# Patient Record
Sex: Female | Born: 1935 | Race: White | Hispanic: No | Marital: Married | Smoking: Never smoker
Health system: Southern US, Community
[De-identification: ages and names within clinical notes are randomized; demographics above are authoritative.]

## PROBLEM LIST (undated history)

## (undated) DIAGNOSIS — D689 Coagulation defect, unspecified: Secondary | ICD-10-CM

## (undated) DIAGNOSIS — E119 Type 2 diabetes mellitus without complications: Secondary | ICD-10-CM

## (undated) DIAGNOSIS — E079 Disorder of thyroid, unspecified: Secondary | ICD-10-CM

## (undated) DIAGNOSIS — R233 Spontaneous ecchymoses: Secondary | ICD-10-CM

## (undated) DIAGNOSIS — I10 Essential (primary) hypertension: Secondary | ICD-10-CM

## (undated) DIAGNOSIS — R238 Other skin changes: Secondary | ICD-10-CM

## (undated) HISTORY — DX: Spontaneous ecchymoses: R23.3

## (undated) HISTORY — DX: Coagulation defect, unspecified: D68.9

## (undated) HISTORY — DX: Essential (primary) hypertension: I10

## (undated) HISTORY — DX: Type 2 diabetes mellitus without complications: E11.9

## (undated) HISTORY — PX: OTHER SURGICAL HISTORY: SHX169

## (undated) HISTORY — DX: Other skin changes: R23.8

## (undated) HISTORY — DX: Disorder of thyroid, unspecified: E07.9

---

## 2013-10-12 ENCOUNTER — Encounter (INDEPENDENT_AMBULATORY_CARE_PROVIDER_SITE_OTHER): Payer: Self-pay

## 2013-10-12 ENCOUNTER — Encounter: Payer: Self-pay | Admitting: Podiatrist

## 2013-10-12 ENCOUNTER — Ambulatory Visit (INDEPENDENT_AMBULATORY_CARE_PROVIDER_SITE_OTHER): Payer: Medicare Other | Admitting: Podiatrist

## 2013-10-12 VITALS — BP 151/83 | HR 90 | Resp 18

## 2013-10-12 DIAGNOSIS — M79609 Pain in unspecified limb: Secondary | ICD-10-CM

## 2013-10-12 DIAGNOSIS — B351 Tinea unguium: Secondary | ICD-10-CM

## 2013-10-12 NOTE — Patient Instructions (Signed)

## 2013-10-12 NOTE — Progress Notes (Signed)
  Subjective:    Patient ID: Martha Walters, female    DOB: 11/26/1929, 79 y.o.   MRN: [id number]  HPI trim my nails and patient is present with daughter.  Patient presents today for routine nail care.     Review of Systems     Objective:   Physical Exam  Neurovascular status is intact and unchanged. Patient's toenails are thickened, discolored, dystrophic, and clinically mycotic x10.      Assessment & Plan:  Assessment: Symptomatic onychomycosis x10 Plan: Debrided the toenails without complication. She will be seen back in 3-6 months for followup

## 2014-01-11 ENCOUNTER — Ambulatory Visit: Payer: Medicare Other | Admitting: Podiatrist

## 2014-01-18 ENCOUNTER — Ambulatory Visit: Payer: Medicare Other | Admitting: Podiatrist

## 2014-02-15 ENCOUNTER — Ambulatory Visit: Payer: Medicare Other | Admitting: Podiatrist

## 2014-04-26 ENCOUNTER — Encounter: Payer: Self-pay | Admitting: Podiatrist

## 2014-04-26 ENCOUNTER — Ambulatory Visit (INDEPENDENT_AMBULATORY_CARE_PROVIDER_SITE_OTHER): Payer: Medicare Other | Admitting: Podiatrist

## 2014-04-26 VITALS — BP 149/64 | HR 83 | Resp 18

## 2014-04-26 DIAGNOSIS — M79609 Pain in unspecified limb: Secondary | ICD-10-CM

## 2014-04-26 DIAGNOSIS — B351 Tinea unguium: Secondary | ICD-10-CM

## 2014-04-26 NOTE — Progress Notes (Signed)
I am here to get my toenails trimmed up and to get some new shoes  HPI: Patient presents today for follow up of diabetic foot and nail care. Past medical history, meds, and allergies reviewed. Patient states blood sugar is under fair  control-- "up and down".  On coumadin for a blood clot in the right leg 15 years ago.  Objective:   Objective:  Patients chart is reviewed.  Vascular status reveals pedal pulses noted at  1 out of 4 dp and pt bilateral .  Neurological sensation is Decreased to [employer] monofilament bilateral at 3/5 sites bilateral.  Dermatological exam reveals  absence of pre ulcerative/ hyperkeratotic lesions.   Toenails are elongated, incurvated, discolored, dystrophic with ingrown deformity present.  Prominent metatarsals 1,5 bilateral are present and asymptomatic   Assessment: Diabetes with Neuropathy/Angiopathy , Ingrown nail deformity, prominent metatarsals  Plan: Discussed treatment options and alternatives. Debrided nails without complication. Recommended diabetic shoes and will fill out the form for her primary care physician.  Return appointment recommended at routine intervals of 3 months.   Trudie Buckler, DPM

## 2014-04-26 NOTE — Patient Instructions (Signed)
DIABETIC SHOES-  Diabetic shoes and accomidative inserts are indicated for those persons who have Diabetes and who are at an increased for a foot ulceration.  This requires patients to have decreased feeling in the feet, circulation problems, as well as an abnormal foot structure, or a combination of these disorders.  As podiatrists, we can recommend a diabetic shoe to your primary care doctor or endocrinologist HOWEVER, it is up to them to decide if you are eligible or in need of these shoes.  Before we can take measurements or do the ordering of a diabetic shoe and accomidative insert, it is required that we get a signed authorization from your physician both confirming you have Diabetes and that you are at risk for an ulceration.  Please note, if you physician will not sign for the shoes, there is no way we can go around him or her.  You are welcome to place an order for the shoes, however the financial responsibility is up to you and your insurance cannot be billed.   Once, approval is granted for the diabetic shoes and inserts we will make you a separate appointment to be measured, casted, and for you to choose the diabetic shoe.  We will then call you when they are ready for dispensing and you will be seen to make sure the shoes and inserts fit your foot properly.  As this is a multi-step process, it generally takes 4-8 weeks from start to finish.  You cooperation is appreciated.  If it has been more than 6 weeks from the date you ordered your diabetic shoe and you have not heard from our office, please give us a call.     

## 2014-06-21 DIAGNOSIS — M6530 Trigger finger, unspecified finger: Secondary | ICD-10-CM | POA: Insufficient documentation

## 2014-08-02 ENCOUNTER — Ambulatory Visit (INDEPENDENT_AMBULATORY_CARE_PROVIDER_SITE_OTHER): Payer: Medicare Other | Admitting: Podiatrist

## 2014-08-02 VITALS — BP 157/77 | HR 77 | Resp 18

## 2014-08-02 DIAGNOSIS — M79609 Pain in unspecified limb: Secondary | ICD-10-CM

## 2014-08-02 DIAGNOSIS — B351 Tinea unguium: Secondary | ICD-10-CM

## 2014-08-02 DIAGNOSIS — M79676 Pain in unspecified toe(s): Principal | ICD-10-CM

## 2014-08-02 NOTE — Progress Notes (Signed)
HPI: Patient presents today for follow up of diabetic foot and nail care. Past medical history, meds, and allergies reviewed. Patient states blood sugar is under fair control-- . On coumadin for a blood clot in the right leg 15 years ago.   Objective: Objective: Patients chart is reviewed. Vascular status reveals pedal pulses noted at 1 out of 4 dp and pt bilateral . Neurological sensation is Decreased to [employer] monofilament bilateral at 3/5 sites bilateral. Dermatological exam reveals absence of pre ulcerative/ hyperkeratotic lesions. Toenails are elongated, incurvated, discolored, dystrophic with ingrown deformity present. Prominent metatarsals 1,5 bilateral are present and asymptomatic   Assessment: Diabetes with Neuropathy/Angiopathy , Ingrown nail deformity, prominent metatarsals   Plan: Discussed treatment options and alternatives. Debrided nails without complication.  Return appointment recommended at routine intervals of 3 months.

## 2014-10-31 ENCOUNTER — Ambulatory Visit: Payer: Medicare Other

## 2014-11-01 ENCOUNTER — Ambulatory Visit: Payer: Medicare Other | Admitting: Podiatrist

## 2015-07-19 ENCOUNTER — Encounter: Payer: Self-pay | Admitting: Podiatry

## 2015-07-19 ENCOUNTER — Ambulatory Visit (INDEPENDENT_AMBULATORY_CARE_PROVIDER_SITE_OTHER): Payer: Medicare Other | Admitting: Podiatry

## 2015-07-19 DIAGNOSIS — B351 Tinea unguium: Secondary | ICD-10-CM

## 2015-07-19 DIAGNOSIS — M79676 Pain in unspecified toe(s): Secondary | ICD-10-CM

## 2015-07-19 NOTE — Progress Notes (Signed)
Patient ID: Martha Walters, female   DOB: Dec 25, 1928, 81 y.o.   MRN: [id number] Complaint:  Visit Type: Patient returns to my office for continued preventative foot care services. Complaint: Patient states" my nails have grown long and thick and become painful to walk and wear shoes" Patient has been diagnosed with DM with no foot complications. The patient presents for preventative foot care services. No changes to ROS.  She requests diabetic shoes.  Podiatric Exam: Vascular: dorsalis pedis and posterior tibial pulses are palpable bilateral. Capillary return is immediate. Temperature gradient is WNL. Skin turgor WNL  Sensorium: Diminished  [employer] monofilament test. Normal tactile sensation bilaterally. Nail Exam: Pt has thick disfigured discolored nails with subungual debris noted bilateral entire nail hallux through fifth toenails Ulcer Exam: There is no evidence of ulcer or pre-ulcerative changes or infection. Orthopedic Exam: Muscle tone and strength are WNL. No limitations in general ROM. No crepitus or effusions noted. Foot type and digits show no abnormalities. Bony prominences are unremarkable. Hammer toes 2,3 B/L Skin: No Porokeratosis. No infection or ulcers  Diagnosis:  Onychomycosis, , Pain in right toe, pain in left toes,  Hammer toes B/L  Treatment & Plan Procedures and Treatment: Consent by patient was obtained for treatment procedures. The patient understood the discussion of treatment and procedures well. All questions were answered thoroughly reviewed. Debridement of mycotic and hypertrophic toenails, 1 through 5 bilateral and clearing of subungual debris. No ulceration, no infection noted. Initiated paperwork for diabetic shoes. Return Visit-Office Procedure: Patient instructed to return to the office for a follow up visit 3 months for continued evaluation and treatment.

## 2015-10-18 ENCOUNTER — Ambulatory Visit: Payer: Medicare Other | Admitting: Podiatry

## 2015-10-18 ENCOUNTER — Ambulatory Visit: Payer: Medicare Other | Admitting: Sports Medicine

## 2015-10-19 ENCOUNTER — Ambulatory Visit: Payer: Medicare Other | Admitting: Sports Medicine

## 2015-10-23 ENCOUNTER — Ambulatory Visit: Payer: Medicare Other

## 2015-12-26 DIAGNOSIS — C9110 Chronic lymphocytic leukemia of B-cell type not having achieved remission: Secondary | ICD-10-CM | POA: Insufficient documentation

## 2016-03-06 ENCOUNTER — Encounter: Payer: Self-pay | Admitting: Sports Medicine

## 2016-03-06 ENCOUNTER — Ambulatory Visit (INDEPENDENT_AMBULATORY_CARE_PROVIDER_SITE_OTHER): Payer: Medicare Other | Admitting: Sports Medicine

## 2016-03-06 DIAGNOSIS — B351 Tinea unguium: Secondary | ICD-10-CM

## 2016-03-06 DIAGNOSIS — I739 Peripheral vascular disease, unspecified: Secondary | ICD-10-CM

## 2016-03-06 DIAGNOSIS — E119 Type 2 diabetes mellitus without complications: Secondary | ICD-10-CM

## 2016-03-06 DIAGNOSIS — M79676 Pain in unspecified toe(s): Secondary | ICD-10-CM

## 2016-03-06 NOTE — Progress Notes (Signed)
Patient ID: Martha Walters, female   DOB: 1929-04-09, 82 y.o.   MRN: [id number] Subjective: Martha Walters is a 82 y.o. female patient with history of diabetes who presents to office today complaining of long, painful nails  while ambulating in shoes; unable to trim. Patient states that the glucose reading this morning was"good"; typically runs well off and on.  Patient denies any new changes in medication or new problems. Patient denies any new cramping, numbness, burning or tingling in the legs.  Reports that they have difficulty getting to appointment and have to wait until their daughter comes from [place] to bring them.   Patient Active Problem List   Diagnosis Date Noted  . Chronic lymphocytic leukemia ([place]) 12/26/2015  . Acquired trigger finger 06/21/2014   Current Outpatient Prescriptions on File Prior to Visit  Medication Sig Dispense Refill  . allopurinol (ZYLOPRIM) 100 MG tablet     . ciprofloxacin (CIPRO) 500 MG tablet     . citalopram (CELEXA) 10 MG tablet     . gabapentin (NEURONTIN) 600 MG tablet 100 mg.    . glipiZIDE (GLUCOTROL XL) 5 MG 24 hr tablet     . LEUKERAN 2 MG tablet     . levothyroxine (LEVOTHROID) 25 MCG tablet Frequency:   Dosage:175   MCG  Instructions:Levothyroxine Sodium (175MCG TABS, 1daily// 2 mondays Oral )  Note:    . levothyroxine (SYNTHROID, LEVOTHROID) 175 MCG tablet Take 175 mcg by mouth daily before breakfast.    . levothyroxine (SYNTHROID, LEVOTHROID) 200 MCG tablet     . losartan (COZAAR) 25 MG tablet     . LYRICA 50 MG capsule     . metFORMIN (GLUCOPHAGE) 500 MG tablet Take by mouth 2 (two) times daily with a meal.    . metFORMIN (GLUCOPHAGE-XR) 500 MG 24 hr tablet     . metoprolol (LOPRESSOR) 100 MG tablet 50 mg.    . metoprolol (LOPRESSOR) 50 MG tablet Take 50 mg by mouth 2 (two) times daily.    . metoprolol succinate (TOPROL-XL) 50 MG 24 hr tablet     . omega-3 acid ethyl esters (LOVAZA) 1 G capsule     . sitaGLIPtin-metformin (JANUMET) 50-1000 MG per  tablet Take 1 tablet by mouth 2 (two) times daily with a meal.    . VESICARE 10 MG tablet     . warfarin (COUMADIN) 1 MG tablet 6 mg.    . warfarin (COUMADIN) 5 MG tablet     . warfarin (COUMADIN) 6 MG tablet Take 6 mg by mouth daily.     No current facility-administered medications on file prior to visit.   Allergies  Allergen Reactions  . Levaquin [Levofloxacin In D5w]   . Levofloxacin     Other reaction(s): HIVES  . Tetanus Toxoid     Other reaction(s): SWELLING    No results found for this or any previous visit (from the past 2160 hour(s)).  Objective: General: Patient is awake, alert, and oriented x 3 and in no acute distress.  Integument: Skin is warm, dry and supple bilateral. Nails are tender, long, thickened and  dystrophic with subungual debris, consistent with onychomycosis, 1-5 bilateral. No signs of infection. No open lesions or preulcerative lesions present bilateral. Remaining integument unremarkable.  Vasculature:  Dorsalis Pedis pulse 1/4 bilateral. Posterior Tibial pulse  1/4 bilateral faint.  Capillary fill time <5 sec 1-5 bilateral. No hair growth to the level of the digits. Temperature gradient within normal limits. + varicosities present and trace edema bilateral.  Neurology: The patient has diminished sensation measured with a 5.07/10g [employer] Monofilament at all pedal sites bilateral . Vibratory sensation diminished bilateral with tuning fork. No Babinski sign present bilateral.   Musculoskeletal: No gross pedal deformities noted bilateral. Muscular strength 5/5 in all lower extremity muscular groups bilateral without pain on range of motion . No tenderness with calf compression bilateral.  Assessment and Plan: Problem List Items Addressed This Visit    None    Visit Diagnoses    Pain due to onychomycosis of toenail    -  Primary    Diabetes mellitus without complication (HCC)        PVD (peripheral vascular disease) ([place])         -Examined  patient. -Discussed and educated patient on diabetic foot care, especially with  regards to the vascular, neurological and musculoskeletal systems.  -Stressed the importance of good glycemic control and the detriment of not  controlling glucose levels in relation to the foot. -Mechanically debrided all nails 1-5 bilateral using sterile nail nipper and filed with dremel without incident  -Answered all patient questions -Patient to return in 3 months for at risk foot care -Patient advised to call the office if any problems or questions arise in the meantime.  Landis Martins, DPM

## 2016-06-06 ENCOUNTER — Ambulatory Visit: Payer: Medicare Other | Admitting: Sports Medicine

## 2016-07-17 ENCOUNTER — Encounter: Payer: Self-pay | Admitting: Sports Medicine

## 2016-07-17 ENCOUNTER — Ambulatory Visit (INDEPENDENT_AMBULATORY_CARE_PROVIDER_SITE_OTHER): Payer: Medicare Other | Admitting: Sports Medicine

## 2016-07-17 DIAGNOSIS — B351 Tinea unguium: Secondary | ICD-10-CM

## 2016-07-17 DIAGNOSIS — E119 Type 2 diabetes mellitus without complications: Secondary | ICD-10-CM

## 2016-07-17 DIAGNOSIS — M79676 Pain in unspecified toe(s): Secondary | ICD-10-CM

## 2016-07-17 DIAGNOSIS — I739 Peripheral vascular disease, unspecified: Secondary | ICD-10-CM

## 2016-07-17 NOTE — Progress Notes (Signed)
Patient ID: Martha Walters, female   DOB: [birth year], 82 y.o.   MRN: [id number] Subjective: Martha Walters is a 82 y.o. female patient with history of diabetes who presents to office today complaining of long, painful nails  while ambulating in shoes; unable to trim. Patient states that the glucose reading this morning was "high" 190.  Patient denies any new changes in medication or new problems. Patient denies any new cramping, numbness, burning or tingling in the legs.  Patient is assisted by son this visit  Patient Active Problem List   Diagnosis Date Noted  . Chronic lymphocytic leukemia ([place]) 12/26/2015  . Acquired trigger finger 06/21/2014   Current Outpatient Prescriptions on File Prior to Visit  Medication Sig Dispense Refill  . allopurinol (ZYLOPRIM) 100 MG tablet     . ciprofloxacin (CIPRO) 500 MG tablet     . citalopram (CELEXA) 10 MG tablet     . gabapentin (NEURONTIN) 600 MG tablet 100 mg.    . glipiZIDE (GLUCOTROL XL) 5 MG 24 hr tablet     . LEUKERAN 2 MG tablet     . levothyroxine (LEVOTHROID) 25 MCG tablet Frequency:   Dosage:175   MCG  Instructions:Levothyroxine Sodium (175MCG TABS, 1daily// 2 mondays Oral )  Note:    . levothyroxine (SYNTHROID, LEVOTHROID) 175 MCG tablet Take 175 mcg by mouth daily before breakfast.    . levothyroxine (SYNTHROID, LEVOTHROID) 200 MCG tablet     . losartan (COZAAR) 25 MG tablet     . LYRICA 50 MG capsule     . metFORMIN (GLUCOPHAGE) 500 MG tablet Take by mouth 2 (two) times daily with a meal.    . metFORMIN (GLUCOPHAGE-XR) 500 MG 24 hr tablet     . metoprolol (LOPRESSOR) 100 MG tablet 50 mg.    . metoprolol (LOPRESSOR) 50 MG tablet Take 50 mg by mouth 2 (two) times daily.    . metoprolol succinate (TOPROL-XL) 50 MG 24 hr tablet     . omega-3 acid ethyl esters (LOVAZA) 1 G capsule     . sitaGLIPtin-metformin (JANUMET) 50-1000 MG per tablet Take 1 tablet by mouth 2 (two) times daily with a meal.    . VESICARE 10 MG tablet     . warfarin  (COUMADIN) 1 MG tablet 6 mg.    . warfarin (COUMADIN) 5 MG tablet     . warfarin (COUMADIN) 6 MG tablet Take 6 mg by mouth daily.     No current facility-administered medications on file prior to visit.    Allergies  Allergen Reactions  . Levaquin [Levofloxacin In D5w]   . Levofloxacin     Other reaction(s): HIVES  . Tetanus Toxoid     Other reaction(s): SWELLING    No results found for this or any previous visit (from the past 2160 hour(s)).  Objective: General: Patient is awake, alert, and oriented x 3 and in no acute distress. Walker assisted gait.   Integument: Skin is warm, dry and supple bilateral. Nails are tender, long, thickened and dystrophic with subungual debris, consistent with onychomycosis, 1-5 bilateral. No signs of infection. No open lesions or preulcerative lesions present bilateral. Remaining integument unremarkable.  Vasculature:  Dorsalis Pedis pulse 1/4 bilateral. Posterior Tibial pulse  1/4 bilateral faint. Capillary fill time <5 sec 1-5 bilateral. No hair growth to the level of the digits.Temperature gradient within normal limits. + varicosities present and trace edema bilateral.  Neurology: The patient has diminished sensation measured with a 5.07/10g [employer] Monofilament at all pedal  sites bilateral . Vibratory sensation diminished bilateral with tuning fork. No Babinski sign present bilateral.   Musculoskeletal: No gross pedal deformities noted bilateral. Muscular strength 5/5 in all lower extremity muscular groups bilateral without pain on range of motion . No tenderness with calf compression bilateral.  Assessment and Plan: Problem List Items Addressed This Visit    None    Visit Diagnoses    Pain due to onychomycosis of toenail    -  Primary   Diabetes mellitus without complication (HCC)       PVD (peripheral vascular disease) ([place])         -Examined patient. -Discussed and educated patient on diabetic foot care, especially with regards to  the vascular, neurological and musculoskeletal systems.  -Stressed the importance of good glycemic control and the detriment of not controlling glucose levels in relation to the foot. -Mechanically debrided all nails 1-5 bilateral using sterile nail nipper and filed with dremel without incident  -Continue with walker for stability in gait -Answered all patient questions -Patient to return in 3 months for at risk foot care -Patient advised to call the office if any problems or questions arise in the meantime.  Landis Martins, DPM

## 2016-09-29 NOTE — Progress Notes (Signed)
Martha Walters, Martha Walters Female, 82 y.o., [birth year] MRN:  [id number]  Transfer from [facility] per Dr. Graylon Good  82 year old lady with past medical history of hypertension, diabetes mellitus, hypothyroidism, depression, CCL in remission, clotting disorder, DVT on Coumadin, who presents with fall, causing small R front intra-cranial bleeding, 1.00.5 cm without midline shift. No focal neurological deficit. Vital signs stable, INR 2.0, electrolytes and renal function okay. Neurosurgeon, Dr. Cyndy Freeze was consulted by EDP, recommended given vitamin K and repeat CT head in morning. 5 mg of Vk was given by EDP. Pt is accepted to tele bed for obs.   Ivor Costa

## 2016-09-30 ENCOUNTER — Observation Stay (HOSPITAL_COMMUNITY): Payer: Medicare Other

## 2016-09-30 ENCOUNTER — Encounter (HOSPITAL_COMMUNITY): Payer: Self-pay | Admitting: Internal Medicine

## 2016-09-30 ENCOUNTER — Inpatient Hospital Stay (HOSPITAL_COMMUNITY)
Admission: AD | Admit: 2016-09-30 | Discharge: 2016-10-03 | DRG: 083 | Disposition: A | Discharge status: Skilled Nursing Facility | Payer: Medicare Other | Source: Other Acute Inpatient Hospital | Attending: Internal Medicine | Admitting: Internal Medicine

## 2016-09-30 DIAGNOSIS — S0230XA Fracture of orbital floor, unspecified side, initial encounter for closed fracture: Secondary | ICD-10-CM

## 2016-09-30 DIAGNOSIS — W010XXA Fall on same level from slipping, tripping and stumbling without subsequent striking against object, initial encounter: Secondary | ICD-10-CM | POA: Diagnosis present

## 2016-09-30 DIAGNOSIS — M6281 Muscle weakness (generalized): Secondary | ICD-10-CM

## 2016-09-30 DIAGNOSIS — S0240CA Maxillary fracture, right side, initial encounter for closed fracture: Secondary | ICD-10-CM | POA: Diagnosis present

## 2016-09-30 DIAGNOSIS — E1165 Type 2 diabetes mellitus with hyperglycemia: Secondary | ICD-10-CM | POA: Diagnosis present

## 2016-09-30 DIAGNOSIS — S065X9A Traumatic subdural hemorrhage with loss of consciousness of unspecified duration, initial encounter: Secondary | ICD-10-CM | POA: Diagnosis present

## 2016-09-30 DIAGNOSIS — Z7984 Long term (current) use of oral hypoglycemic drugs: Secondary | ICD-10-CM | POA: Diagnosis not present

## 2016-09-30 DIAGNOSIS — Y92009 Unspecified place in unspecified non-institutional (private) residence as the place of occurrence of the external cause: Secondary | ICD-10-CM

## 2016-09-30 DIAGNOSIS — C9110 Chronic lymphocytic leukemia of B-cell type not having achieved remission: Secondary | ICD-10-CM | POA: Diagnosis present

## 2016-09-30 DIAGNOSIS — L304 Erythema intertrigo: Secondary | ICD-10-CM | POA: Diagnosis present

## 2016-09-30 DIAGNOSIS — E119 Type 2 diabetes mellitus without complications: Secondary | ICD-10-CM

## 2016-09-30 DIAGNOSIS — Z7901 Long term (current) use of anticoagulants: Secondary | ICD-10-CM | POA: Diagnosis not present

## 2016-09-30 DIAGNOSIS — Z881 Allergy status to other antibiotic agents status: Secondary | ICD-10-CM

## 2016-09-30 DIAGNOSIS — S02401A Maxillary fracture, unspecified, initial encounter for closed fracture: Secondary | ICD-10-CM

## 2016-09-30 DIAGNOSIS — E039 Hypothyroidism, unspecified: Secondary | ICD-10-CM | POA: Diagnosis present

## 2016-09-30 DIAGNOSIS — Z79899 Other long term (current) drug therapy: Secondary | ICD-10-CM | POA: Diagnosis not present

## 2016-09-30 DIAGNOSIS — R269 Unspecified abnormalities of gait and mobility: Secondary | ICD-10-CM

## 2016-09-30 DIAGNOSIS — S0181XA Laceration without foreign body of other part of head, initial encounter: Secondary | ICD-10-CM | POA: Diagnosis present

## 2016-09-30 DIAGNOSIS — I6200 Nontraumatic subdural hemorrhage, unspecified: Secondary | ICD-10-CM | POA: Diagnosis not present

## 2016-09-30 DIAGNOSIS — I10 Essential (primary) hypertension: Secondary | ICD-10-CM | POA: Diagnosis present

## 2016-09-30 DIAGNOSIS — Z794 Long term (current) use of insulin: Secondary | ICD-10-CM | POA: Diagnosis not present

## 2016-09-30 DIAGNOSIS — S02402A Zygomatic fracture, unspecified, initial encounter for closed fracture: Secondary | ICD-10-CM

## 2016-09-30 DIAGNOSIS — S0083XA Contusion of other part of head, initial encounter: Secondary | ICD-10-CM

## 2016-09-30 DIAGNOSIS — S0280XA Fracture of other specified skull and facial bones, unspecified side, initial encounter for closed fracture: Secondary | ICD-10-CM

## 2016-09-30 DIAGNOSIS — E0865 Diabetes mellitus due to underlying condition with hyperglycemia: Secondary | ICD-10-CM | POA: Diagnosis not present

## 2016-09-30 DIAGNOSIS — R410 Disorientation, unspecified: Secondary | ICD-10-CM

## 2016-09-30 DIAGNOSIS — S02402D Zygomatic fracture, unspecified, subsequent encounter for fracture with routine healing: Secondary | ICD-10-CM | POA: Diagnosis not present

## 2016-09-30 DIAGNOSIS — S065XAA Traumatic subdural hemorrhage with loss of consciousness status unknown, initial encounter: Secondary | ICD-10-CM | POA: Diagnosis present

## 2016-09-30 DIAGNOSIS — S0240EA Zygomatic fracture, right side, initial encounter for closed fracture: Secondary | ICD-10-CM | POA: Diagnosis present

## 2016-09-30 LAB — GLUCOSE, CAPILLARY
GLUCOSE-CAPILLARY: 290 mg/dL — AB (ref 65–99)
GLUCOSE-CAPILLARY: 295 mg/dL — AB (ref 65–99)
Glucose-Capillary: 312 mg/dL — ABNORMAL HIGH (ref 65–99)
Glucose-Capillary: 147 mg/dL — ABNORMAL HIGH (ref 65–99)
Glucose-Capillary: 224 mg/dL — ABNORMAL HIGH (ref 65–99)
Glucose-Capillary: 299 mg/dL — ABNORMAL HIGH (ref 65–99)

## 2016-09-30 LAB — PROTIME-INR
INR: 1.43
Prothrombin Time: 17.6 seconds — ABNORMAL HIGH (ref 11.4–15.2)

## 2016-09-30 MED ORDER — CHLORAMBUCIL 2 MG PO TABS
2.0000 mg | ORAL_TABLET | ORAL | Status: DC
  Administered 2016-09-30 – 2016-10-02 (×2): 2 mg via ORAL
  Filled 2016-09-30 (×3): qty 1

## 2016-09-30 MED ORDER — INSULIN ASPART 100 UNIT/ML ~~LOC~~ SOLN
0.0000 [IU] | SUBCUTANEOUS | Status: DC
  Administered 2016-09-30: 5 [IU] via SUBCUTANEOUS

## 2016-09-30 MED ORDER — CITALOPRAM HYDROBROMIDE 10 MG PO TABS
10.0000 mg | ORAL_TABLET | Freq: Every day | ORAL | Status: DC
  Administered 2016-09-30 – 2016-10-03 (×3): 10 mg via ORAL
  Filled 2016-09-30 (×3): qty 1

## 2016-09-30 MED ORDER — ALLOPURINOL 100 MG PO TABS
100.0000 mg | ORAL_TABLET | Freq: Every day | ORAL | Status: DC
  Administered 2016-09-30 – 2016-10-03 (×4): 100 mg via ORAL
  Filled 2016-09-30 (×4): qty 1

## 2016-09-30 MED ORDER — INSULIN ASPART 100 UNIT/ML ~~LOC~~ SOLN
0.0000 [IU] | Freq: Three times a day (TID) | SUBCUTANEOUS | Status: DC
  Administered 2016-09-30: 5 [IU] via SUBCUTANEOUS
  Administered 2016-09-30: 3 [IU] via SUBCUTANEOUS
  Administered 2016-09-30: 1 [IU] via SUBCUTANEOUS
  Administered 2016-10-01: 9 [IU] via SUBCUTANEOUS
  Administered 2016-10-01: 7 [IU] via SUBCUTANEOUS
  Administered 2016-10-01: 3 [IU] via SUBCUTANEOUS
  Administered 2016-10-02: 7 [IU] via SUBCUTANEOUS
  Administered 2016-10-02 (×2): 3 [IU] via SUBCUTANEOUS
  Administered 2016-10-03: 5 [IU] via SUBCUTANEOUS
  Administered 2016-10-03: 3 [IU] via SUBCUTANEOUS

## 2016-09-30 MED ORDER — FAMOTIDINE 10 MG PO TABS
10.0000 mg | ORAL_TABLET | Freq: Two times a day (BID) | ORAL | Status: DC
  Administered 2016-09-30 – 2016-10-03 (×7): 10 mg via ORAL
  Filled 2016-09-30 (×7): qty 1

## 2016-09-30 MED ORDER — OMEGA-3-ACID ETHYL ESTERS 1 G PO CAPS
1.0000 g | ORAL_CAPSULE | Freq: Two times a day (BID) | ORAL | Status: DC
  Administered 2016-09-30 – 2016-10-03 (×7): 1 g via ORAL
  Filled 2016-09-30 (×7): qty 1

## 2016-09-30 MED ORDER — SODIUM CHLORIDE 0.9% FLUSH
3.0000 mL | Freq: Two times a day (BID) | INTRAVENOUS | Status: DC
  Administered 2016-09-30 – 2016-10-03 (×6): 3 mL via INTRAVENOUS

## 2016-09-30 MED ORDER — NYSTATIN 100000 UNIT/GM EX POWD
Freq: Three times a day (TID) | CUTANEOUS | Status: DC
  Administered 2016-09-30 – 2016-10-03 (×8): via TOPICAL
  Filled 2016-09-30: qty 15

## 2016-09-30 MED ORDER — INSULIN GLARGINE 100 UNIT/ML ~~LOC~~ SOLN
10.0000 [IU] | Freq: Every day | SUBCUTANEOUS | Status: DC
  Administered 2016-09-30 – 2016-10-01 (×3): 10 [IU] via SUBCUTANEOUS
  Filled 2016-09-30 (×4): qty 0.1

## 2016-09-30 MED ORDER — METOPROLOL SUCCINATE ER 25 MG PO TB24
25.0000 mg | ORAL_TABLET | Freq: Every day | ORAL | Status: DC
  Administered 2016-09-30 – 2016-10-01 (×2): 25 mg via ORAL
  Filled 2016-09-30 (×2): qty 1

## 2016-09-30 MED ORDER — INSULIN ASPART 100 UNIT/ML ~~LOC~~ SOLN
0.0000 [IU] | Freq: Every day | SUBCUTANEOUS | Status: DC
  Administered 2016-09-30 – 2016-10-01 (×2): 3 [IU] via SUBCUTANEOUS

## 2016-09-30 MED ORDER — FLUCONAZOLE 100 MG PO TABS
150.0000 mg | ORAL_TABLET | ORAL | Status: DC
  Administered 2016-09-30: 150 mg via ORAL
  Filled 2016-09-30: qty 2

## 2016-09-30 MED ORDER — LEVOTHYROXINE SODIUM 100 MCG PO TABS
200.0000 ug | ORAL_TABLET | Freq: Every day | ORAL | Status: DC
  Administered 2016-09-30 – 2016-10-03 (×4): 200 ug via ORAL
  Filled 2016-09-30 (×4): qty 2

## 2016-09-30 NOTE — Progress Notes (Signed)
Patient admitted directly from [facility] from a mechanical fall yesterday. On arrival, patient verbal, alert and oriented. Vital signs checked and placed on O2@2L   Placed on cardiac monitor and made her comfortable in bed. Call bell placed within reach and bed alarm activated. Will keep monitoring.

## 2016-09-30 NOTE — Progress Notes (Addendum)
PROGRESS NOTE    Martha Walters  [id number] DOB: [birth year] DOA: 09/30/2016 PCP: Martha Spanish, MD   Brief Narrative: Martha Walters is a 82 y.o. female with medical history significant of CLL, DM, HTN, on chronic coumadin for a DVT in leg a couple of years back. Patient presented to [facility] ED after suffering a fall from moving too fast with her walker. She was found to have a subdural hematoma.   Assessment & Plan:   Principal Problem:   SDH (subdural hematoma) (HCC) Active Problems:   DM2 (diabetes mellitus, type 2) (HCC)   HTN (hypertension)   Subdural hemorrhage Sustained after fall. Stable on repeat CT. No abnormal neurological signs -neurosurgery recommendations -continue neuro checks -continue to hold coumadin, hold coumadin on discharge -PT eval  Facial fracture Incomplete right zygomaticomaxillary complex fracture seen on CT this morning. Patient without pain. -ENT (Dr. Erik Obey) consulted  Facial hematoma, right Right supraorbital laceration Swelling improved. Laceration not sutured. Wound is about 16 hours out at this time. May need to allow to heal by secondary intention.  -ENT recommendations  Diabetes mellitus -continue to hold glipizide -continue lantus 10u -continue SSI  Intertrigo -Wound care  DVT prophylaxis: SCDs Code Status: Full code Family Communication: Daughter at bedside Disposition Plan: Likely discharge home tomorrow   Consultants:   Neurosurgery, Dr. Cyndy Freeze  ENT, Dr. Erik Obey  Procedures:  None  Antimicrobials:  None    Subjective: Patient reports no pain today. Feels fine.  Objective: Vitals:   09/30/16 0100 09/30/16 0500 09/30/16 0941  BP: (!) 156/49 (!) 159/55 (!) 161/52  Pulse: 89 85 93  Resp: 18 17 16   Temp: 98.1 F (36.7 C) 98.3 F (36.8 C) 98.4 F (36.9 C)  TempSrc: Oral Oral Oral  SpO2: 97% 98% 96%   No intake or output data in the 24 hours ending 09/30/16 1021 There were no vitals filed for this  visit.  Examination:  General exam: Appears calm and comfortable  Head: no crepitus felt. No overt deformity. Laceration above right upper eye without bleeding and significant swelling on right side of face with bruising. Eyes: EOMI intact bilaterally, no evidence of trapping. Unable to lift right upper eyelid well when looking down Respiratory system: Clear to auscultation. Respiratory effort normal. Cardiovascular system: S1 & S2 heard, RRR. No murmurs, rubs, gallops or clicks. Gastrointestinal system: Abdomen is nondistended, soft and nontender. Normal bowel sounds heard. Central nervous system: Alert and oriented. No focal neurological deficits. Extremities: No edema. No calf tenderness Skin: No cyanosis. Significant inflammatory changes seen under bilateral breasts, under abdominal panus and bilateral inguinal creases Psychiatry: Judgement and insight appear normal. Mood & affect appropriate.     Data Reviewed: I have personally reviewed following labs and imaging studies  CBC: No results for input(s): WBC, NEUTROABS, HGB, HCT, MCV, PLT in the last 168 hours. Basic Metabolic Panel: No results for input(s): NA, K, CL, CO2, GLUCOSE, BUN, CREATININE, CALCIUM, MG, PHOS in the last 168 hours. GFR: CrCl cannot be calculated (Unknown ideal weight.). Liver Function Tests: No results for input(s): AST, ALT, ALKPHOS, BILITOT, PROT, ALBUMIN in the last 168 hours. No results for input(s): LIPASE, AMYLASE in the last 168 hours. No results for input(s): AMMONIA in the last 168 hours. Coagulation Profile:  Recent Labs Lab 09/30/16 0633  INR 1.43   Cardiac Enzymes: No results for input(s): CKTOTAL, CKMB, CKMBINDEX, TROPONINI in the last 168 hours. BNP (last 3 results) No results for input(s): PROBNP in the last 8760 hours. HbA1C: No  results for input(s): HGBA1C in the last 72 hours. CBG:  Recent Labs Lab 09/30/16 0228 09/30/16 0422 09/30/16 0742  GLUCAP 312* 290* 147*   Lipid  Profile: No results for input(s): CHOL, HDL, LDLCALC, TRIG, CHOLHDL, LDLDIRECT in the last 72 hours. Thyroid Function Tests: No results for input(s): TSH, T4TOTAL, FREET4, T3FREE, THYROIDAB in the last 72 hours. Anemia Panel: No results for input(s): VITAMINB12, FOLATE, FERRITIN, TIBC, IRON, RETICCTPCT in the last 72 hours. Sepsis Labs: No results for input(s): PROCALCITON, LATICACIDVEN in the last 168 hours.  No results found for this or any previous visit (from the past 240 hour(s)).       Radiology Studies: Ct Head Wo Contrast  Result Date: 09/30/2016 CLINICAL DATA:  Fall yesterday.  Followup subdural hematoma. EXAM: CT HEAD WITHOUT CONTRAST TECHNIQUE: Contiguous axial images were obtained from the base of the skull through the vertex without intravenous contrast. COMPARISON:  [facility] from yesterday. FINDINGS: Brain: Unchanged 5 x 9 mm parenchymal hemorrhage in the subcortical right frontal lobe anteriorly. Thin rim of surrounding edema is present. No other site of intracranial hemorrhage, including visible subdural hematoma. Generalized atrophy. Chronic small vessel ischemic change in the cerebral white matter. Vascular: Atherosclerotic calcification. Skull: Negative Sinuses/Orbits: Incomplete right zygomaticomaxillary complex fractures, sparing the lateral orbital rim and zygoma. Right maxillary hemo sinus. Stable mild extraconal hemorrhage on the right orbital floor with mild proptosis. Bilateral cataract resection. IMPRESSION: 1. Stable 9 mm right frontal parenchymal hemorrhage. 2. Incomplete right zygomaticomaxillary complex fracture as described. Electronically Signed   By: Monte Fantasia M.D.   On: 09/30/2016 08:57        Scheduled Meds: . allopurinol  100 mg Oral Daily  . chlorambucil  2 mg Oral Q M,W,F,Su-1800  . citalopram  10 mg Oral QHS  . famotidine  10 mg Oral BID  . insulin aspart  0-5 Units Subcutaneous QHS  . insulin aspart  0-9 Units Subcutaneous TID WC   . insulin glargine  10 Units Subcutaneous QHS  . levothyroxine  200 mcg Oral QAC breakfast  . metoprolol succinate  25 mg Oral QHS  . nystatin   Topical TID  . omega-3 acid ethyl esters  1 g Oral BID  . sodium chloride flush  3 mL Intravenous Q12H   Continuous Infusions:    LOS: 0 days     Cordelia Poche [facility] 09/30/2016, 10:21 AM Pager: [phone]  If 7PM-7AM, please contact night-coverage [email or web address] Password [email or web address] 09/30/2016, 10:21 AM

## 2016-09-30 NOTE — Consult Note (Signed)
Imaging reviewed.  Small right frontal contusion.  No role for surgery.  Hold coumadin for two weeks.  No need for additional imaging or follow up.

## 2016-09-30 NOTE — H&P (Signed)
History and Physical    Martha Walters [id number] DOB: Apr 20, 1929 DOA: 09/30/2016   PCP: Guadlupe Spanish, MD Chief Complaint: Fall, ICH  HPI: Martha Walters is a 82 y.o. female with medical history significant of CLL, DM, HTN, on chronic coumadin for a DVT in leg a couple of years back.  Patient had mechanical fall at home, fell and struck head.  Presents to [facility] ED after fall.  ED Course: Has small R front intra-cranial bleeding SDH, 1.0x0.5cm area of bleeding without midline shift.  INR 2.0, given 5mg  of Vit K, and Dr. Cyndy Freeze recommended repeat CT head in AM.  Review of Systems: As per HPI otherwise 10 point review of systems negative.    Past Medical History:  Diagnosis Date  . Bruises easily   . Clotting disorder ([place])   . Diabetes mellitus without complication ([place])   . Hypertension   . Thyroid disease     No past surgical history on file.   reports that she has never smoked. She has never used smokeless tobacco. She reports that she does not drink alcohol or use drugs.  Allergies  Allergen Reactions  . Levaquin [Levofloxacin In D5w]   . Levofloxacin     Other reaction(s): HIVES Other reaction(s): HIVES  . Phenergan [Promethazine Hcl] Other (See Comments)    Over Sedation  . Tetanus Toxoid     Other reaction(s): SWELLING Other reaction(s): SWELLING    No family history on file. Daughter is alive and well.  No h/o bleeding disorder.   Prior to Admission medications   Medication Sig Start Date End Date Taking? Authorizing Provider  allopurinol (ZYLOPRIM) 100 MG tablet  03/26/14   Historical Provider, MD  ciprofloxacin (CIPRO) 500 MG tablet  04/07/14   Historical Provider, MD  citalopram (CELEXA) 10 MG tablet  06/18/14   Historical Provider, MD  gabapentin (NEURONTIN) 600 MG tablet 100 mg.    Historical Provider, MD  glipiZIDE (GLUCOTROL XL) 5 MG 24 hr tablet  04/23/14   Historical Provider, MD  LEUKERAN 2 MG tablet  04/12/14   Historical Provider, MD  levothyroxine  (LEVOTHROID) 25 MCG tablet Frequency:   Dosage:175   MCG  Instructions:Levothyroxine Sodium (175MCG TABS, 1daily// 2 mondays Oral )  Note:    Historical Provider, MD  levothyroxine (SYNTHROID, LEVOTHROID) 175 MCG tablet Take 175 mcg by mouth daily before breakfast.    Historical Provider, MD  levothyroxine (SYNTHROID, LEVOTHROID) 200 MCG tablet  07/17/15   Historical Provider, MD  losartan (COZAAR) 25 MG tablet  07/15/14   Historical Provider, MD  LYRICA 50 MG capsule  07/29/14   Historical Provider, MD  metFORMIN (GLUCOPHAGE) 500 MG tablet Take by mouth 2 (two) times daily with a meal.    Historical Provider, MD  metFORMIN (GLUCOPHAGE-XR) 500 MG 24 hr tablet  07/12/15   Historical Provider, MD  metoprolol (LOPRESSOR) 100 MG tablet 50 mg.    Historical Provider, MD  metoprolol (LOPRESSOR) 50 MG tablet Take 50 mg by mouth 2 (two) times daily.    Historical Provider, MD  metoprolol succinate (TOPROL-XL) 50 MG 24 hr tablet  03/26/14   Historical Provider, MD  omega-3 acid ethyl esters (LOVAZA) 1 G capsule  07/07/15   Historical Provider, MD  sitaGLIPtin-metformin (JANUMET) 50-1000 MG per tablet Take 1 tablet by mouth 2 (two) times daily with a meal.    Historical Provider, MD  VESICARE 10 MG tablet  04/01/14   Historical Provider, MD  warfarin (COUMADIN) 1 MG tablet 6 mg.  Historical Provider, MD  warfarin (COUMADIN) 5 MG tablet  06/14/15   Historical Provider, MD  warfarin (COUMADIN) 6 MG tablet Take 6 mg by mouth daily.    Historical Provider, MD    Physical Exam: Vitals:   09/30/16 0100  BP: (!) 156/49  Pulse: 89  Resp: 18  Temp: 98.1 F (36.7 C)  TempSrc: Oral  SpO2: 97%      Constitutional: NAD, calm, comfortable Eyes: PERRL, lids and conjunctivae normal ENMT: Mucous membranes are moist. Posterior pharynx clear of any exudate or lesions.Normal dentition.  Neck: normal, supple, no masses, no thyromegaly Respiratory: clear to auscultation bilaterally, no wheezing, no crackles. Normal  respiratory effort. No accessory muscle use.  Cardiovascular: Regular rate and rhythm, no murmurs / rubs / gallops. No extremity edema. 2+ pedal pulses. No carotid bruits.  Abdomen: no tenderness, no masses palpated. No hepatosplenomegaly. Bowel sounds positive.  Musculoskeletal: no clubbing / cyanosis. No joint deformity upper and lower extremities. Good ROM, no contractures. Normal muscle tone.  Skin: no rashes, lesions, ulcers. No induration Neurologic: CN 2-12 grossly intact. Sensation intact, DTR normal. Strength 5/5 in all 4.  Psychiatric: Normal judgment and insight. Alert and oriented x 3. Normal mood.    Labs on Admission: I have personally reviewed following labs and imaging studies  CBC: No results for input(s): WBC, NEUTROABS, HGB, HCT, MCV, PLT in the last 168 hours. Basic Metabolic Panel: No results for input(s): NA, K, CL, CO2, GLUCOSE, BUN, CREATININE, CALCIUM, MG, PHOS in the last 168 hours. GFR: CrCl cannot be calculated (Unknown ideal weight.). Liver Function Tests: No results for input(s): AST, ALT, ALKPHOS, BILITOT, PROT, ALBUMIN in the last 168 hours. No results for input(s): LIPASE, AMYLASE in the last 168 hours. No results for input(s): AMMONIA in the last 168 hours. Coagulation Profile: No results for input(s): INR, PROTIME in the last 168 hours. Cardiac Enzymes: No results for input(s): CKTOTAL, CKMB, CKMBINDEX, TROPONINI in the last 168 hours. BNP (last 3 results) No results for input(s): PROBNP in the last 8760 hours. HbA1C: No results for input(s): HGBA1C in the last 72 hours. CBG: No results for input(s): GLUCAP in the last 168 hours. Lipid Profile: No results for input(s): CHOL, HDL, LDLCALC, TRIG, CHOLHDL, LDLDIRECT in the last 72 hours. Thyroid Function Tests: No results for input(s): TSH, T4TOTAL, FREET4, T3FREE, THYROIDAB in the last 72 hours. Anemia Panel: No results for input(s): VITAMINB12, FOLATE, FERRITIN, TIBC, IRON, RETICCTPCT in the last  72 hours. Urine analysis: No results found for: COLORURINE, APPEARANCEUR, LABSPEC, PHURINE, GLUCOSEU, HGBUR, BILIRUBINUR, KETONESUR, PROTEINUR, UROBILINOGEN, NITRITE, LEUKOCYTESUR Sepsis Labs: @LABRCNTIP (procalcitonin:4,lacticidven:4) )No results found for this or any previous visit (from the past 240 hour(s)).   Radiological Exams on Admission: No results found.  EKG: Independently reviewed.  Assessment/Plan Principal Problem:   SDH (subdural hematoma) (HCC) Active Problems:   DM2 (diabetes mellitus, type 2) (HCC)   HTN (hypertension)    1. SDH - 1. Per Dr. Hewitt Shorts recs 1. Patient got 5mg  vit K in ED 2. Repeat CT head in AM ordered 2. Repeat INR in AM ordered 3. Stopping coumadin that she was taking for a DVT a couple of years ago 4. Frequent neuro checks 2. HTN - continue home BP meds 3. DM2 - 1. holding glipizide 2. Will give half dose home lantus (10 units) 3. Sensitive scale SSI Q4H   DVT prophylaxis: SCDs only Code Status: Full Family Communication: Daughter at bedside Consults called: Dr. Cyndy Freeze called by EDP Admission status: Admit to  obs   Etta Quill DO [facility] Pager [phone] from 7PM-7AM  If 7AM-7PM, please contact the day physician for the patient [email or web address] Password [email or web address]  09/30/2016, 2:28 AM

## 2016-09-30 NOTE — Consult Note (Signed)
Martha Walters, Martha Walters 82 y.o., female [id number]     Chief Complaint: facial trauma  HPI: 82 yo wf, stumbled and fell last PM.  Sustained a small subdural hematoma and a minimally displaced fx of body of RIGHT zygoma and RIGHT orbital floor.  No complaints of pain, vision disturbance or diplopia.  No trismus or malocclusion.    PMH: Past Medical History:  Diagnosis Date  . Bruises easily   . Clotting disorder ([place])   . Diabetes mellitus without complication ([place])   . Hypertension   . Thyroid disease     Surg Hx: Past Surgical History:  Procedure Laterality Date  . trigger finger injection      FHx:  No family history on file. SocHx:  reports that she has never smoked. She has never used smokeless tobacco. She reports that she does not drink alcohol or use drugs.  ALLERGIES:  Allergies  Allergen Reactions  . Phenergan [Promethazine Hcl] Other (See Comments)    Over Sedation  . Tetanus Toxoid Swelling  . Levaquin [Levofloxacin In D5w] Hives  . Levofloxacin Hives    Medications Prior to Admission  Medication Sig Dispense Refill  . allopurinol (ZYLOPRIM) 100 MG tablet Take 100 mg by mouth every morning.     . citalopram (CELEXA) 10 MG tablet Take 10 mg by mouth every evening.     Marland Kitchen glipiZIDE (GLUCOTROL XL) 5 MG 24 hr tablet Take 5 mg by mouth 2 (two) times daily.     . insulin glargine (LANTUS) 100 UNIT/ML injection Inject 20 Units into the skin at bedtime.    Marland Kitchen LEUKERAN 2 MG tablet Take 2 mg by mouth See admin instructions. Take 2 mg by mouth on Monday, Wednesday, Friday and Sunday.    . levothyroxine (SYNTHROID, LEVOTHROID) 200 MCG tablet Take 200 mcg by mouth daily before breakfast.    . metoprolol succinate (TOPROL-XL) 50 MG 24 hr tablet Take 25 mg by mouth daily. Take with or immediately following a meal.    . Multiple Vitamins-Minerals (CENTRUM SILVER PO) Take 1 tablet by mouth daily.    Marland Kitchen omega-3 acid ethyl esters (LOVAZA) 1 G capsule Take 1 g by mouth 2 (two) times daily.      Marland Kitchen OVER THE COUNTER MEDICATION Take 2 capsules by mouth 2 (two) times daily. Sesame Seed Oil    . ranitidine (ZANTAC) 75 MG tablet Take 75 mg by mouth 2 (two) times daily.    . Red Yeast Rice 600 MG CAPS Take 600 mg by mouth 2 (two) times daily.    Marland Kitchen warfarin (COUMADIN) 5 MG tablet Take 7.5-10 mg by mouth See admin instructions. Take 7.5 mg by mouth daily alternating every other day with 10 mg by mouth daily.      Results for orders placed or performed during the hospital encounter of 09/30/16 (from the past 48 hour(s))  Glucose, capillary     Status: Abnormal   Collection Time: 09/30/16  2:28 AM  Result Value Ref Range   Glucose-Capillary 312 (H) 65 - 99 mg/dL  Glucose, capillary     Status: Abnormal   Collection Time: 09/30/16  4:22 AM  Result Value Ref Range   Glucose-Capillary 290 (H) 65 - 99 mg/dL  Protime-INR     Status: Abnormal   Collection Time: 09/30/16  6:33 AM  Result Value Ref Range   Prothrombin Time 17.6 (H) 11.4 - 15.2 seconds   INR 1.43   Glucose, capillary     Status: Abnormal  Collection Time: 09/30/16  7:42 AM  Result Value Ref Range   Glucose-Capillary 147 (H) 65 - 99 mg/dL  Glucose, capillary     Status: Abnormal   Collection Time: 09/30/16 11:11 AM  Result Value Ref Range   Glucose-Capillary 224 (H) 65 - 99 mg/dL   Comment 1 Notify RN    Comment 2 Document in Chart    Ct Head Wo Contrast  Result Date: 09/30/2016 CLINICAL DATA:  Fall yesterday.  Followup subdural hematoma. EXAM: CT HEAD WITHOUT CONTRAST TECHNIQUE: Contiguous axial images were obtained from the base of the skull through the vertex without intravenous contrast. COMPAR[facility]:  [facility] from yesterday. FINDINGS: Brain: Unchanged 5 x 9 mm parenchymal hemorrhage in the subcortical right frontal lobe anteriorly. Thin rim of surrounding edema is present. No other site of intracranial hemorrhage, including visible subdural hematoma. Generalized atrophy. Chronic small vessel ischemic change in  the cerebral white matter. Vascular: Atherosclerotic calcification. Skull: Negative Sinuses/Orbits: Incomplete right zygomaticomaxillary complex fractures, sparing the lateral orbital rim and zygoma. Right maxillary hemo sinus. Stable mild extraconal hemorrhage on the right orbital floor with mild proptosis. Bilateral cataract resection. IMPRESSION: 1. Stable 9 mm right frontal parenchymal hemorrhage. 2. Incomplete right zygomaticomaxillary complex fracture as described. Electronically Signed   By: Monte Fantasia M.D.   On: 09/30/2016 08:57      Blood pressure (!) 161/52, pulse 93, temperature 98.4 F (36.9 C), temperature source Oral, resp. rate 16, SpO2 96 %.  PHYSICAL EXAM: Overall appearance:  RIGHT facial and peri-orbital ecchymoses.  Mental status sharp. Head:  No palpable or visible displacement of the RIGHT zygoma. Eyes:  Subconjunctival hemorrhage OD.  EOMI, No diplopia or dysconjugate gaze.   Ears: not examined Nose:  Not examined internally, external nose intact. Oral Cavity:  Full upper and lower plated Oral Pharynx/Hypopharynx/Larynx:  Sl old blood Neuro: grossly intact Neck:  intact  Studies Reviewed:CT head    Assessment/Plan Fx RIGHT zygoma including orbital floor with minimal displacement.  Will not require surgical repair unless entrapment is noted.  Plan:  Ice x 24 hrs.  Elevation x 3-4 nights.  No nose blowing x 2 weeks.  Amoxicillin, Keflex or similar x 10 days.  Eval with her Ophthalmologist (Dr. Bing Plume).  Recheck my office prn.  Jodi Marble 0000000, 12:14 PM

## 2016-09-30 NOTE — Evaluation (Signed)
Physical Therapy Evaluation Patient Details Name: Martha Walters MRN: [id number] DOB: [birth year] Today's Date: 09/30/2016   History of Present Illness  Martha Walters is a 82 y.o. female with medical history significant of CLL, DM, HTN, on chronic coumadin for a DVT in leg a couple of years back.  Patient had mechanical fall at home, fell and struck head.  CT positive for 9 mm right frontal parenchymal hemorrhage and Incomplete right zygomaticomaxillary complex fracture.   Clinical Impression  Patient presents with decreased independence and safety with mobility.  She has fallen several times lately, now with SDH and fracture.  Feel she continues to be at high risk for falls and spouse unable to provide appropriate level of assist.  Feel safest d/c plan is STSNF for continued skilled rehab at inpatient facility prior to d/c home.  However, if not admitted for inpatient stay, she may need HHPT/OT/aide with more family or hired help in the home. PT to follow to address issues of safety, weakness, balance and gait.    Follow Up Recommendations SNF    Equipment Recommendations  Rolling walker with 5" wheels    Recommendations for Other Services       Precautions / Restrictions Precautions Precautions: Fall Precaution Comments: daughter reports multiple falls lately, but this is worst      Mobility  Bed Mobility Overal bed mobility: Needs Assistance Bed Mobility: Sidelying to Sit   Sidelying to sit: Mod assist;Min assist       General bed mobility comments: assist for balance as pt lifting her trunk and for scooting out to EOB  Transfers Overall transfer level: Needs assistance Equipment used: Rolling walker (2 wheeled) Transfers: Sit to/from [facility] Sit to Stand: Min assist Stand pivot transfers: Mod assist       General transfer comment: able to stand from EOB with steadying assist, but pt very anxious and fearful and wanting to move as quickly as possible;  cues for slowing down, visualization and pivot to chair with RW min/mod A for walker management, cues for safety, pt sat prior to backing all the way to chair despite cues  Ambulation/Gait             General Gait Details: deferred due to fear, generalized weakness  Stairs            Wheelchair Mobility    Modified Rankin (Stroke Patients Only)       Balance Overall balance assessment: Needs assistance Sitting-balance support: Feet supported Sitting balance-Leahy Scale: Fair     Standing balance support: Bilateral upper extremity supported Standing balance-Leahy Scale: Poor Standing balance comment: very fearful and reliant on UE support with walker                             Pertinent Vitals/Pain Pain Assessment: No/denies pain    Home Living Family/patient expects to be discharged to:: Private residence Living Arrangements: Spouse/significant other Available Help at Discharge: Family;Available 24 hours/day Type of Home: House Home Access: Level entry   Entrance Stairs-Number of Steps: at front level, some steps in back Home Layout: One level Home Equipment: Walker - 4 wheels;Bedside commode Additional Comments: sponge bathes    Prior Function Level of Independence: Needs assistance   Gait / Transfers Assistance Needed: was able to walk with walker to bathroom until recently when had a fever one day and was really weak  ADL's / Homemaking Assistance Needed: spouse assist  with all  IADL's but he is also on a walker        Hand Dominance   Dominant Hand: Right    Extremity/Trunk Assessment   Upper Extremity Assessment: Generalized weakness           Lower Extremity Assessment: Generalized weakness      Cervical / Trunk Assessment: Kyphotic  Communication   Communication: No difficulties  Cognition Arousal/Alertness: Awake/alert Behavior During Therapy: WFL for tasks assessed/performed Overall Cognitive Status: History of  cognitive impairments - at baseline                      General Comments General comments (skin integrity, edema, etc.): daughter presents and assists with some history as pt inconsistent at times.  Pt. with ecchymosis and errythema R side of face and neck    Exercises     Assessment/Plan    PT Assessment Patient needs continued PT services  PT Problem List Decreased activity tolerance;Decreased balance;Decreased mobility;Decreased strength;Decreased safety awareness;Decreased knowledge of use of DME          PT Treatment Interventions DME instruction;Gait training;Functional mobility training;Balance training;Therapeutic exercise;Patient/family education;Therapeutic activities    PT Goals (Current goals can be found in the Care Plan section)  Acute Rehab PT Goals Patient Stated Goal: To get back to independent PT Goal Formulation: With patient/family Time For Goal Achievement: 10/14/16 Potential to Achieve Goals: Good    Frequency Min 3X/week   Barriers to discharge Decreased caregiver support spouse on walker, daughter works, checks in once a week    Co-evaluation               End of Session Equipment Utilized During Treatment: Gait belt Activity Tolerance: Other (comment) (limited by fear and weakness) Patient left: with call bell/phone within reach;in chair;with chair alarm set;with family/visitor present Nurse Communication: Mobility status    Functional Assessment Tool Used: Clinical Judgement Functional Limitation: Mobility: Walking and moving around Mobility: Walking and Moving Around Current Status [phone]): At least 20 percent but less than 40 percent impaired, limited or restricted Mobility: Walking and Moving Around Goal Status [phone]): At least 20 percent but less than 40 percent impaired, limited or restricted    Time: [id number] PT Time Calculation (min) (ACUTE ONLY): 33 min   Charges:   PT Evaluation $PT Eval Moderate Complexity: 1  Procedure PT Treatments $Therapeutic Activity: 8-22 mins   PT G Codes:   PT G-Codes **NOT FOR INPATIENT CLASS** Functional Assessment Tool Used: Clinical Judgement Functional Limitation: Mobility: Walking and moving around Mobility: Walking and Moving Around Current Status [id number]): At least 20 percent but less than 40 percent impaired, limited or restricted Mobility: Walking and Moving Around Goal Status [phone]): At least 20 percent but less than 40 percent impaired, limited or restricted    Reginia Naas 09/30/2016, 4:36 PM  Magda Kiel, [place] 09/30/2016

## 2016-09-30 NOTE — Consult Note (Signed)
[place] Nurse wound consult note Reason for Consult: intertriginous skin damage Wound type: same Pressure Ulcer POA: No Measurement: large area affected under both left and right breast Wound bed: erythematous with satellite lesions  Drainage (amount, consistency, odor) none Periwound: intact Dressing procedure/placement/frequency: Add antimicrobial wicking fabric for moisture and treatment of candida.  Explained use to the bedside nurse, patient and family. Suggested PO Diflucan as well to [facility] due to severity of skin irritation.  Discussed POC with patient and bedside nurse.  Re consult if needed, will not follow at this time. Thanks  Timarie Labell [employer], RN,CNS, [place] [phone])

## 2016-09-30 NOTE — Care Management Note (Signed)
Case Management Note  Patient Details  Name: Kayliana Ackerley MRN: [id number] Date of Birth: 16-Jul-1929  Subjective/Objective:     Pt admitted with fall resulting in SDH and bleed. She is from home with her spouse.               Action/Plan: Awaiting PT/OT recommendations. CM following for d/c needs.   Expected Discharge Date:  10/01/16               Expected Discharge Plan:     In-House Referral:     Discharge planning Services     Post Acute Care Choice:    Choice offered to:     DME Arranged:    DME Agency:     HH Arranged:    HH Agency:     Status of Service:  In process, will continue to follow  If discussed at Long Length of Stay Meetings, dates discussed:    Additional Comments:  Pollie Friar, RN 09/30/2016, 2:03 PM

## 2016-10-01 DIAGNOSIS — S0280XA Fracture of other specified skull and facial bones, unspecified side, initial encounter for closed fracture: Secondary | ICD-10-CM

## 2016-10-01 DIAGNOSIS — S02401A Maxillary fracture, unspecified, initial encounter for closed fracture: Secondary | ICD-10-CM

## 2016-10-01 DIAGNOSIS — S02402A Zygomatic fracture, unspecified, initial encounter for closed fracture: Secondary | ICD-10-CM

## 2016-10-01 DIAGNOSIS — S0083XA Contusion of other part of head, initial encounter: Secondary | ICD-10-CM

## 2016-10-01 DIAGNOSIS — R410 Disorientation, unspecified: Secondary | ICD-10-CM

## 2016-10-01 DIAGNOSIS — S0230XA Fracture of orbital floor, unspecified side, initial encounter for closed fracture: Secondary | ICD-10-CM

## 2016-10-01 LAB — GLUCOSE, CAPILLARY
GLUCOSE-CAPILLARY: 263 mg/dL — AB (ref 65–99)
Glucose-Capillary: 349 mg/dL — ABNORMAL HIGH (ref 65–99)
Glucose-Capillary: 351 mg/dL — ABNORMAL HIGH (ref 65–99)
Glucose-Capillary: 248 mg/dL — ABNORMAL HIGH (ref 65–99)

## 2016-10-01 LAB — BASIC METABOLIC PANEL
Anion gap: 5 (ref 5–15)
BUN: 10 mg/dL (ref 6–20)
CHLORIDE: 102 mmol/L (ref 101–111)
CO2: 24 mmol/L (ref 22–32)
CREATININE: 0.92 mg/dL (ref 0.44–1.00)
Calcium: 8.6 mg/dL — ABNORMAL LOW (ref 8.9–10.3)
GFR calc Af Amer: >60 mL/min (ref >60)
GFR calc non Af Amer: 55 mL/min — ABNORMAL LOW (ref >60)
Glucose, Bld: 425 mg/dL — ABNORMAL HIGH (ref 65–99)
POTASSIUM: 4.1 mmol/L (ref 3.5–5.1)
Sodium: 131 mmol/L — ABNORMAL LOW (ref 135–145)

## 2016-10-01 LAB — CBC
HEMATOCRIT: 30.3 % — AB (ref 36.0–46.0)
HEMOGLOBIN: 9.3 g/dL — AB (ref 12.0–15.0)
MCH: 27.9 pg (ref 26.0–34.0)
MCHC: 30.7 g/dL (ref 30.0–36.0)
MCV: 91 fL (ref 78.0–100.0)
Platelets: 104 10*3/uL — ABNORMAL LOW (ref 150–400)
RBC: 3.33 MIL/uL — ABNORMAL LOW (ref 3.87–5.11)
RDW: 17.2 % — ABNORMAL HIGH (ref 11.5–15.5)
WBC: 62 10*3/uL (ref 4.0–10.5)

## 2016-10-01 LAB — AMMONIA: Ammonia: 16 umol/L (ref 9–35)

## 2016-10-01 LAB — TSH: TSH: 0.463 u[IU]/mL (ref 0.350–4.500)

## 2016-10-01 MED ORDER — ONDANSETRON HCL 4 MG/2ML IJ SOLN
4.0000 mg | Freq: Four times a day (QID) | INTRAMUSCULAR | Status: DC | PRN
  Administered 2016-10-02: 4 mg via INTRAVENOUS
  Filled 2016-10-01 (×2): qty 2

## 2016-10-01 MED ORDER — AMOXICILLIN 500 MG PO CAPS
500.0000 mg | ORAL_CAPSULE | Freq: Two times a day (BID) | ORAL | Status: DC
  Administered 2016-10-01 – 2016-10-03 (×5): 500 mg via ORAL
  Filled 2016-10-01 (×6): qty 1

## 2016-10-01 NOTE — Progress Notes (Signed)
PROGRESS NOTE    Martha Walters  [id number] DOB: [birth year] DOA: 09/30/2016 PCP: Guadlupe Spanish, MD   Brief Narrative: Martha Walters is a 82 y.o. female with medical history significant of CLL, DM, HTN, on chronic coumadin for a DVT in leg a couple of years back. Patient presented to [facility] ED after suffering a fall from moving too fast with her walker. She was found to have a subdural hematoma.   Assessment & Plan:   Principal Problem:   SDH (subdural hematoma) (HCC) Active Problems:   DM2 (diabetes mellitus, type 2) (HCC)   HTN (hypertension)   Closed fracture of zygomaticomaxillary complex (HCC)   Facial hematoma   Subdural hemorrhage Sustained after fall. Stable on repeat CT. No abnormal neurological signs -neurosurgery recommendations: hold coumadin for 2 weeks. No repeat imaging -continue neuro checks -continue to hold coumadin, hold coumadin on discharge -PT eval: SNF -repeat CBC to ensure stable (hgb 10.2 at [facility])  Facial fracture Incomplete right zygomaticomaxillary complex fracture seen on CT this morning. Patient without pain. -ENT (Dr. Erik Obey): start amoxicillin; outpatient follow-up with ophthalmologist (Dr. Bing Plume), outpatient ENT follow-up prn) -repeat eye exams  Facial hematoma, right Right supraorbital laceration Swelling improved. Laceration not sutured. Wound is about 16 hours out at this time. May need to allow to heal by secondary intention.  -ENT recommendations as above   Confusion Likely secondary to recent trauma. Patient likely with concussion. Urinalysis was reviewed from [place] and does not suggest infection. Memory worsened. Bleeding stable. Neurosurgery signed off. BUN slightly elevated from review (20) -supportive care -repeat BMP -ammonia -TSH  Diabetes mellitus -continue to hold glipizide -continue lantus 10u -continue SSI  Intertrigo -Wound care -diflucan weekly. Will need to be mindful of diflucan-warfarin interaction  once warfarin restarted  CLL Patient takes Leukeran 2mg . Followed by Dr. Wendee Beavers at [facility]. Last WBC 66.4k at [place]. -outpatient follow-up with hematology  DVT prophylaxis: SCDs Code Status: Full code Family Communication: Daughter at bedside Disposition Plan: Likely discharge home tomorrow   Consultants:   Neurosurgery, Dr. Cyndy Freeze  ENT, Dr. Erik Obey  Procedures:  None  Antimicrobials:  None    Subjective: Patient reports no pain today but states she is sore. Feels fine.  Objective: Vitals:   09/30/16 2113 10/01/16 0108 10/01/16 0559 10/01/16 1018  BP: (!) 168/51 (!) 179/72 (!) 167/64 (!) 170/60  Pulse: 88 (!) 108 96 94  Resp: 18 18 18 18   Temp: 98.4 F (36.9 C) 98.9 F (37.2 C) 98.3 F (36.8 C) 98.7 F (37.1 C)  TempSrc: Oral Oral Oral Oral  SpO2: 97% 95% 96%   Weight:      Height:       No intake or output data in the 24 hours ending 10/01/16 1207 Filed Weights   09/30/16 1900  Weight: 84 kg (185 lb 3 oz)    Examination:  General exam: Appears calm and comfortable  Head: no crepitus felt. No overt deformity. Laceration above right upper eye without bleeding and significant swelling on right side of face with bruising. Eyes: EOMI intact bilaterally, no evidence of trapping. Respiratory system: Clear to auscultation. Respiratory effort normal. Cardiovascular system: S1 & S2 heard, RRR. No murmurs, rubs, gallops or clicks. Gastrointestinal system: Abdomen is nondistended, soft and nontender. Normal bowel sounds heard. Central nervous system: Alert and oriented to person only. No focal neurological deficits. Extremities: No edema. No calf tenderness Skin: No cyanosis. Significant inflammatory changes seen under bilateral breasts, under abdominal panus and bilateral inguinal creases Psychiatry: Judgement and  insight appear normal. Mood & affect appropriate.     Data Reviewed: I have personally reviewed following labs and imaging studies  CBC: No  results for input(s): WBC, NEUTROABS, HGB, HCT, MCV, PLT in the last 168 hours. Basic Metabolic Panel: No results for input(s): NA, K, CL, CO2, GLUCOSE, BUN, CREATININE, CALCIUM, MG, PHOS in the last 168 hours. GFR: CrCl cannot be calculated (No order found.). Liver Function Tests: No results for input(s): AST, ALT, ALKPHOS, BILITOT, PROT, ALBUMIN in the last 168 hours. No results for input(s): LIPASE, AMYLASE in the last 168 hours. No results for input(s): AMMONIA in the last 168 hours. Coagulation Profile:  Recent Labs Lab 09/30/16 0633  INR 1.43   Cardiac Enzymes: No results for input(s): CKTOTAL, CKMB, CKMBINDEX, TROPONINI in the last 168 hours. BNP (last 3 results) No results for input(s): PROBNP in the last 8760 hours. HbA1C: No results for input(s): HGBA1C in the last 72 hours. CBG:  Recent Labs Lab 09/30/16 1111 09/30/16 1636 09/30/16 2112 10/01/16 0555 10/01/16 1148  GLUCAP 224* 295* 299* 263* 349*   Lipid Profile: No results for input(s): CHOL, HDL, LDLCALC, TRIG, CHOLHDL, LDLDIRECT in the last 72 hours. Thyroid Function Tests: No results for input(s): TSH, T4TOTAL, FREET4, T3FREE, THYROIDAB in the last 72 hours. Anemia Panel: No results for input(s): VITAMINB12, FOLATE, FERRITIN, TIBC, IRON, RETICCTPCT in the last 72 hours. Sepsis Labs: No results for input(s): PROCALCITON, LATICACIDVEN in the last 168 hours.  No results found for this or any previous visit (from the past 240 hour(s)).       Radiology Studies: Ct Head Wo Contrast  Result Date: 09/30/2016 CLINICAL DATA:  Fall yesterday.  Followup subdural hematoma. EXAM: CT HEAD WITHOUT CONTRAST TECHNIQUE: Contiguous axial images were obtained from the base of the skull through the vertex without intravenous contrast. COMPARISON:  [facility] from yesterday. FINDINGS: Brain: Unchanged 5 x 9 mm parenchymal hemorrhage in the subcortical right frontal lobe anteriorly. Thin rim of surrounding edema is  present. No other site of intracranial hemorrhage, including visible subdural hematoma. Generalized atrophy. Chronic small vessel ischemic change in the cerebral white matter. Vascular: Atherosclerotic calcification. Skull: Negative Sinuses/Orbits: Incomplete right zygomaticomaxillary complex fractures, sparing the lateral orbital rim and zygoma. Right maxillary hemo sinus. Stable mild extraconal hemorrhage on the right orbital floor with mild proptosis. Bilateral cataract resection. IMPRESSION: 1. Stable 9 mm right frontal parenchymal hemorrhage. 2. Incomplete right zygomaticomaxillary complex fracture as described. Electronically Signed   By: Monte Fantasia M.D.   On: 09/30/2016 08:57        Scheduled Meds: . allopurinol  100 mg Oral Daily  . chlorambucil  2 mg Oral Q M,W,F,Su-1800  . citalopram  10 mg Oral QHS  . famotidine  10 mg Oral BID  . fluconazole  150 mg Oral Weekly  . insulin aspart  0-5 Units Subcutaneous QHS  . insulin aspart  0-9 Units Subcutaneous TID WC  . insulin glargine  10 Units Subcutaneous QHS  . levothyroxine  200 mcg Oral QAC breakfast  . metoprolol succinate  25 mg Oral QHS  . nystatin   Topical TID  . omega-3 acid ethyl esters  1 g Oral BID  . sodium chloride flush  3 mL Intravenous Q12H   Continuous Infusions:    LOS: 1 day     Cordelia Poche [facility] 10/01/2016, 12:07 PM Pager: [phone]  If 7PM-7AM, please contact night-coverage [email or web address] Password [email or web address] 10/01/2016, 12:07 PM

## 2016-10-01 NOTE — NC FL2 (Signed)
[place] LEVEL OF CARE SCREENING TOOL     IDENTIFICATION  Patient Name: Martha Walters Birthdate: 05-19-1929 Sex: female Admission Date (Current Location): 09/30/2016  [facility] and [place] Number:  [employer] and Address:  [facility] [place] [place] [address], [place], [place] [address]      Provider Number: [id number]  Attending Physician Name and Address:  Mariel Aloe, MD  Relative Name and Phone Number:  Rashaunda Roever (Spouse) - [phone]    Current Level of Care: Hospital Recommended Level of Care: [place] Prior Approval Number:    Date Approved/Denied:   PASRR Number:   [id number]   Discharge Plan: SNF    Current Diagnoses: Patient Active Problem List   Diagnosis Date Noted  . SDH (subdural hematoma) ([place]) 09/30/2016  . DM2 (diabetes mellitus, type 2) ([place]) 09/30/2016  . HTN (hypertension) 09/30/2016  . Chronic lymphocytic leukemia ([place]) 12/26/2015  . Acquired trigger finger 06/21/2014    Orientation RESPIRATION BLADDER Height & Weight     Self, Time, Situation, Place  Normal Continent Weight: 185 lb 3 oz (84 kg) Height:  5\' 8"  (172.7 cm)  BEHAVIORAL SYMPTOMS/MOOD NEUROLOGICAL BOWEL NUTRITION STATUS      Continent Diet (Carb Modified)  AMBULATORY STATUS COMMUNICATION OF NEEDS Skin   Limited Assist Verbally Other (Comment), Skin abrasions (yeast infection under bilateral breasts; skin abrasion right lower leg)                       Personal Care Assistance Level of Assistance  Bathing, Feeding, Dressing Bathing Assistance: Limited assistance Feeding assistance: Independent Dressing Assistance: Limited assistance     Functional Limitations Info  Sight, Hearing, Speech Sight Info: Impaired Hearing Info: Adequate Speech Info: Adequate    SPECIAL CARE FACTORS FREQUENCY   PT   5x/week                  Contractures Contractures Info: Not present    Additional Factors Info   Allergies, Code Status, Insulin Sliding Scale Code Status Info: Full Code Allergies Info: Phenergan Promethazine Hcl, Tetanus Toxoid, Levaquin Levofloxacin In D5w, Levofloxacin   Insulin Sliding Scale Info: insulin aspart (novoLOG) injection 0-9 Units 0-9 Units, Subcutaneous, 3 times daily with meals        Current Medications (10/01/2016):  This is the current hospital active medication list Current Facility-Administered Medications  Medication Dose Route Frequency Provider Last Rate Last Dose  . allopurinol (ZYLOPRIM) tablet 100 mg  100 mg Oral Daily Etta Quill, DO   100 mg at 10/01/16 1021  . chlorambucil (LEUKERAN) tablet 2 mg  2 mg Oral Q M,W,F,Su-1800 Etta Quill, DO   2 mg at 09/30/16 1710  . citalopram (CELEXA) tablet 10 mg  10 mg Oral QHS Etta Quill, DO   10 mg at 09/30/16 2201  . famotidine (PEPCID) tablet 10 mg  10 mg Oral BID Etta Quill, DO   10 mg at 10/01/16 1021  . fluconazole (DIFLUCAN) tablet 150 mg  150 mg Oral Weekly Mariel Aloe, MD   150 mg at 09/30/16 1207  . insulin aspart (novoLOG) injection 0-5 Units  0-5 Units Subcutaneous QHS Mariel Aloe, MD   3 Units at 09/30/16 2242  . insulin aspart (novoLOG) injection 0-9 Units  0-9 Units Subcutaneous TID WC Mariel Aloe, MD   3 Units at 10/01/16 0831  . insulin glargine (LANTUS) injection 10 Units  10 Units  Subcutaneous QHS Etta Quill, DO   10 Units at 09/30/16 2201  . levothyroxine (SYNTHROID, LEVOTHROID) tablet 200 mcg  200 mcg Oral QAC breakfast Etta Quill, DO   200 mcg at 10/01/16 [id number]  . metoprolol succinate (TOPROL-XL) 24 hr tablet 25 mg  25 mg Oral QHS Etta Quill, DO   25 mg at 09/30/16 2200  . nystatin (MYCOSTATIN/NYSTOP) topical powder   Topical TID Etta Quill, DO      . omega-3 acid ethyl esters (LOVAZA) capsule 1 g  1 g Oral BID Etta Quill, DO   1 g at 10/01/16 1021  . ondansetron (ZOFRAN) injection 4 mg  4 mg Intravenous Q6H PRN Mariel Aloe, MD      . sodium  chloride flush (NS) 0.9 % injection 3 mL  3 mL Intravenous Q12H Etta Quill, DO   3 mL at 09/30/16 2243     Discharge Medications: Please see discharge summary for a list of discharge medications.  Relevant Imaging Results:  Relevant Lab Results:   Additional Information SSN:  [id number]  Lajoyce Lauber Work [phone]

## 2016-10-01 NOTE — Progress Notes (Signed)
Physical Therapy Treatment Patient Details Name: Armetha Redden MRN: [id number] DOB: [birth year] Today's Date: 10/01/2016    History of Present Illness Mikaylin Arzate is a 82 y.o. female with medical history significant of CLL, DM, HTN, on chronic coumadin for a DVT in leg a couple of years back.  Patient had mechanical fall at home, fell and struck head.  CT positive for 9 mm right frontal parenchymal hemorrhage and Incomplete right zygomaticomaxillary complex fracture.     PT Comments    Current plan remains appropriate. Pt required mod A for gait and +2 for safety due to unsteadiness and impulsivity. Pt continues to be very fearful of falling and needs max encouragement for OOB mobility.   Follow Up Recommendations  SNF     Equipment Recommendations  Rolling walker with 5" wheels    Recommendations for Other Services       Precautions / Restrictions Precautions Precautions: Fall Precaution Comments: daughter reports multiple falls lately, but this is worst    Mobility  Bed Mobility Overal bed mobility: Needs Assistance Bed Mobility: Supine to Sit     Supine to sit: Mod assist;+2 for physical assistance;HOB elevated     General bed mobility comments: assist for all aspects of bed mobility; pt with decreased initiation and follow through of tasks  Transfers Overall transfer level: Needs assistance Equipment used: Rolling walker (2 wheeled) Transfers: Sit to/from Stand Sit to Stand: Min assist         General transfer comment: assist for to steady upon standing and verbal and tactile cues for safe hand placement  Ambulation/Gait Ambulation/Gait assistance: Mod assist (close chair follow) Ambulation Distance (Feet): 45 Feet Assistive device: Rolling walker (2 wheeled) Gait Pattern/deviations: Step-through pattern;Decreased stride length     General Gait Details: max cues for posture and safe use of AD with assist to manage RW; pt unsteady and has tendency to take  hand(s) off of RW and reach out to touch things (wallpaper, etc...) and with LOB requiring assist to recover and then state "see I'm going to fall"; pt required max encouragement to ambulate and continue ambulation once in hallway due to fear of falling; pt with c/o nausea but no emesis; daughter reported that RN is aware of nausea   Stairs            Wheelchair Mobility    Modified Rankin (Stroke Patients Only)       Balance     Sitting balance-Leahy Scale: Fair       Standing balance-Leahy Scale: Poor                      Cognition Arousal/Alertness: Awake/alert Behavior During Therapy: WFL for tasks assessed/performed Overall Cognitive Status: History of cognitive impairments - at baseline                      Exercises      General Comments General comments (skin integrity, edema, etc.): daughter present for session      Pertinent Vitals/Pain Pain Assessment: No/denies pain    Home Living                      Prior Function            PT Goals (current goals can now be found in the care plan section) Acute Rehab PT Goals Patient Stated Goal: To get back to independent Progress towards PT goals: Progressing toward goals    Frequency  Min 3X/week      PT Plan Current plan remains appropriate    Co-evaluation             End of Session Equipment Utilized During Treatment: Gait belt Activity Tolerance: Patient tolerated treatment well Patient left: in chair;with call bell/phone within reach;with chair alarm set;with family/visitor present     Time: 0936-1000 PT Time Calculation (min) (ACUTE ONLY): 24 min  Charges:  $Gait Training: 8-22 mins $Therapeutic Activity: 8-22 mins                    G Codes:      Salina April, PTA Pager: [phone]   10/01/2016, 10:31 AM

## 2016-10-01 NOTE — Progress Notes (Signed)
Critical lab value of WBC 62.0 received. MD page notified. Will await new orders. Arta Silence, RN  10/01/2016 1:38 PM

## 2016-10-02 DIAGNOSIS — E0865 Diabetes mellitus due to underlying condition with hyperglycemia: Secondary | ICD-10-CM

## 2016-10-02 LAB — BASIC METABOLIC PANEL
ANION GAP: 9 (ref 5–15)
BUN: 11 mg/dL (ref 6–20)
CALCIUM: 8.6 mg/dL — AB (ref 8.9–10.3)
CHLORIDE: 104 mmol/L (ref 101–111)
CO2: 25 mmol/L (ref 22–32)
Creatinine, Ser: 0.81 mg/dL (ref 0.44–1.00)
GFR calc non Af Amer: >60 mL/min (ref >60)
Glucose, Bld: 253 mg/dL — ABNORMAL HIGH (ref 65–99)
POTASSIUM: 4 mmol/L (ref 3.5–5.1)
Sodium: 138 mmol/L (ref 135–145)

## 2016-10-02 LAB — GLUCOSE, CAPILLARY
GLUCOSE-CAPILLARY: 235 mg/dL — AB (ref 65–99)
GLUCOSE-CAPILLARY: 332 mg/dL — AB (ref 65–99)
Glucose-Capillary: 217 mg/dL — ABNORMAL HIGH (ref 65–99)
Glucose-Capillary: 200 mg/dL — ABNORMAL HIGH (ref 65–99)

## 2016-10-02 LAB — CBC
HEMATOCRIT: 30.2 % — AB (ref 36.0–46.0)
HEMOGLOBIN: 9.4 g/dL — AB (ref 12.0–15.0)
MCH: 27.9 pg (ref 26.0–34.0)
MCHC: 31.1 g/dL (ref 30.0–36.0)
MCV: 89.6 fL (ref 78.0–100.0)
Platelets: 107 10*3/uL — ABNORMAL LOW (ref 150–400)
RBC: 3.37 MIL/uL — AB (ref 3.87–5.11)
RDW: 16.6 % — ABNORMAL HIGH (ref 11.5–15.5)
WBC: 54.5 10*3/uL (ref 4.0–10.5)

## 2016-10-02 MED ORDER — METOPROLOL SUCCINATE ER 25 MG PO TB24
50.0000 mg | ORAL_TABLET | Freq: Every day | ORAL | Status: DC
  Administered 2016-10-03: 50 mg via ORAL
  Filled 2016-10-02: qty 2

## 2016-10-02 MED ORDER — GLIPIZIDE 5 MG PO TABS
10.0000 mg | ORAL_TABLET | Freq: Every day | ORAL | Status: DC
  Administered 2016-10-02 – 2016-10-03 (×2): 10 mg via ORAL
  Filled 2016-10-02 (×2): qty 2

## 2016-10-02 MED ORDER — INSULIN GLARGINE 100 UNIT/ML ~~LOC~~ SOLN
18.0000 [IU] | Freq: Every day | SUBCUTANEOUS | Status: DC
  Administered 2016-10-03: 18 [IU] via SUBCUTANEOUS
  Filled 2016-10-02 (×2): qty 0.18

## 2016-10-02 MED ORDER — INSULIN ASPART 100 UNIT/ML ~~LOC~~ SOLN
5.0000 [IU] | Freq: Three times a day (TID) | SUBCUTANEOUS | Status: DC
  Administered 2016-10-02 – 2016-10-03 (×5): 5 [IU] via SUBCUTANEOUS

## 2016-10-02 NOTE — Progress Notes (Signed)
PROGRESS NOTE                                                                                                                                                                                                             Patient Demographics:    Martha Walters, is a 82 y.o. female, DOB - Sep 14, 1929, [id number]  Admit date - 09/30/2016   Admitting Physician Ivor Costa, MD  Outpatient Primary MD for the patient is Guadlupe Spanish, MD  LOS - 2  Outpatient Specialists: Oncology  No chief complaint on file.      Brief Narrative  82 year old female with history of CLL, diabetes mellitus on oral hypoglycemic, hypertension, chronic Coumadin for DVT of leg since 2 years back presented to [facility] ED after sustaining a fall while using her walker. She was found to have subdural hematoma and minimally displaced fracture of body of right zygoma and right orbital floor.   Subjective:   Patient denies any pain. Blood glucose elevated past 24 hours. She has mild confusion.   Assessment  & Plan :    Principal Problem:   SDH (subdural hematoma) (HCC) Secondary to fall. Stable on repeat CT. No focal neurological deficit except for some confusion. Neurosurgery recommended to hold Coumadin for 2 weeks on repeat imaging needed. Keep head of bed elevated for another 2 nights, no nose blowing for 2 weeks. And oxygen for 10 day course. Continue neuro checks. Seen by physical therapy and recommended SNF.  Active Problems:   DM2 (diabetes mellitus, type 2), uncontrolled (HCC) CBG in 300s-400s past 24-hour hours. Only on glipizide at home which is resumed. Check A1c. I will increase her Lantus to 18 units at bedtime and add pre-meal aspart.   Right facial fracture Incomplete right zygomaticomaxillary complex fracture. No pain or visual disturbance. Appreciate ENT evaluation. Continue empiric amoxicillin. Outpatient follow-up with  ophthalmologist (Dr Bing Plume). Follow up with ENT as needed.  Right facial hematoma/right supraorbital laceration Swelling better. Laceration not sutured and recommend heating by secondary intention.  Essential hypertension Blood pressure elevated. Increase Toprol to 50 mg daily.   Hypothyroidism Continue Synthroid.   CLL Being monitored by Dr. Theora Master at [facility].   Code Status : Full code  Family Communication  : Daughter at bedside  Disposition Plan  : Skilled nursing facility on 10/26 if confusion improved, blood glucose  and hypertension better controlled.  Barriers For Discharge : Uncontrolled diabetes and hypertension, confusion  Consults  :  ENT (Dr Erik Obey)  Procedures  :  Head CT  DVT Prophylaxis  :  SCDs  Lab Results  Component Value Date   PLT 107 (L) 10/02/2016    Antibiotics  :   Anti-infectives    Start     Dose/Rate Route Frequency Ordered Stop   10/01/16 1300  amoxicillin (AMOXIL) capsule 500 mg     500 mg Oral Every 12 hours 10/01/16 1222     09/30/16 1200  fluconazole (DIFLUCAN) tablet 150 mg     150 mg Oral Weekly 09/30/16 1114          Objective:   Vitals:   10/02/16 0118 10/02/16 0503 10/02/16 0920 10/02/16 1320  BP: (!) 161/58 (!) 134/58 (!) 158/48 (!) 170/56  Pulse: 93 82 85 86  Resp: 18 18 20 20   Temp: 98.5 F (36.9 C) 98.3 F (36.8 C) 98.7 F (37.1 C) 98.1 F (36.7 C)  TempSrc: Oral Oral Oral Oral  SpO2: 96% 95% 94% 94%  Weight:      Height:        Wt Readings from Last 3 Encounters:  09/30/16 84 kg (185 lb 3 oz)     Intake/Output Summary (Last 24 hours) at 10/02/16 1341 Last data filed at 10/02/16 0921  Gross per 24 hour  Intake              360 ml  Output                0 ml  Net              360 ml     Physical Exam  Gen: not in distress,  HEENT: swelling of the right side of the face with bruising, able to open eyes better. mucosa, supple neck Chest: clear b/l, no added sounds CVS: N S1&S2, no murmurs,  GI:  soft, NT, ND, BS+ Musculoskeletal: warm, no edema CNS: Alert and oriented with some confusion.    Data Review:    CBC  Recent Labs Lab 10/01/16 1237 10/02/16 0646  WBC 62.0* 54.5*  HGB 9.3* 9.4*  HCT 30.3* 30.2*  PLT 104* 107*  MCV 91.0 89.6  MCH 27.9 27.9  MCHC 30.7 31.1  RDW 17.2* 16.6*    Chemistries   Recent Labs Lab 10/01/16 1237 10/02/16 0646  NA 131* 138  K 4.1 4.0  CL 102 104  CO2 24 25  GLUCOSE 425* 253*  BUN 10 11  CREATININE 0.92 0.81  CALCIUM 8.6* 8.6*   ------------------------------------------------------------------------------------------------------------------ No results for input(s): CHOL, HDL, LDLCALC, TRIG, CHOLHDL, LDLDIRECT in the last 72 hours.  No results found for: HGBA1C ------------------------------------------------------------------------------------------------------------------  Recent Labs  10/01/16 1405  TSH 0.463   ------------------------------------------------------------------------------------------------------------------ No results for input(s): VITAMINB12, FOLATE, FERRITIN, TIBC, IRON, RETICCTPCT in the last 72 hours.  Coagulation profile  Recent Labs Lab 09/30/16 0633  INR 1.43    No results for input(s): DDIMER in the last 72 hours.  Cardiac Enzymes No results for input(s): CKMB, TROPONINI, MYOGLOBIN in the last 168 hours.  Invalid input(s): CK ------------------------------------------------------------------------------------------------------------------ No results found for: BNP  Inpatient Medications  Scheduled Meds: . allopurinol  100 mg Oral Daily  . amoxicillin  500 mg Oral Q12H  . chlorambucil  2 mg Oral Q M,W,F,Su-1800  . citalopram  10 mg Oral QHS  . famotidine  10 mg Oral BID  . fluconazole  150 mg Oral Weekly  . glipiZIDE  10 mg Oral QAC breakfast  . insulin aspart  0-5 Units Subcutaneous QHS  . insulin aspart  0-9 Units Subcutaneous TID WC  . insulin aspart  5 Units  Subcutaneous TID WC  . insulin glargine  10 Units Subcutaneous QHS  . levothyroxine  200 mcg Oral QAC breakfast  . metoprolol succinate  25 mg Oral QHS  . nystatin   Topical TID  . omega-3 acid ethyl esters  1 g Oral BID  . sodium chloride flush  3 mL Intravenous Q12H   Continuous Infusions:  PRN Meds:.ondansetron (ZOFRAN) IV  Micro Results No results found for this or any previous visit (from the past 240 hour(s)).  Radiology Reports Ct Head Wo Contrast  Result Date: 09/30/2016 CLINICAL DATA:  Fall yesterday.  Followup subdural hematoma. EXAM: CT HEAD WITHOUT CONTRAST TECHNIQUE: Contiguous axial images were obtained from the base of the skull through the vertex without intravenous contrast. COMPARISON:  [facility] from yesterday. FINDINGS: Brain: Unchanged 5 x 9 mm parenchymal hemorrhage in the subcortical right frontal lobe anteriorly. Thin rim of surrounding edema is present. No other site of intracranial hemorrhage, including visible subdural hematoma. Generalized atrophy. Chronic small vessel ischemic change in the cerebral white matter. Vascular: Atherosclerotic calcification. Skull: Negative Sinuses/Orbits: Incomplete right zygomaticomaxillary complex fractures, sparing the lateral orbital rim and zygoma. Right maxillary hemo sinus. Stable mild extraconal hemorrhage on the right orbital floor with mild proptosis. Bilateral cataract resection. IMPRESSION: 1. Stable 9 mm right frontal parenchymal hemorrhage. 2. Incomplete right zygomaticomaxillary complex fracture as described. Electronically Signed   By: Monte Fantasia M.D.   On: 09/30/2016 08:57    Time Spent in minutes  25   Louellen Molder M.D on 10/02/2016 at 1:41 PM  Between 7am to 7pm - Pager - [phone]  After 7pm go to [email or web address] - password [facility]  [facility] -  Office  [phone]

## 2016-10-03 DIAGNOSIS — S02402D Zygomatic fracture, unspecified, subsequent encounter for fracture with routine healing: Secondary | ICD-10-CM

## 2016-10-03 DIAGNOSIS — S0083XA Contusion of other part of head, initial encounter: Secondary | ICD-10-CM

## 2016-10-03 DIAGNOSIS — R410 Disorientation, unspecified: Secondary | ICD-10-CM

## 2016-10-03 LAB — GLUCOSE, CAPILLARY
GLUCOSE-CAPILLARY: 216 mg/dL — AB (ref 65–99)
GLUCOSE-CAPILLARY: 263 mg/dL — AB (ref 65–99)

## 2016-10-03 LAB — CBC
HEMATOCRIT: 29.7 % — AB (ref 36.0–46.0)
HEMOGLOBIN: 9.2 g/dL — AB (ref 12.0–15.0)
MCH: 27.8 pg (ref 26.0–34.0)
MCHC: 31 g/dL (ref 30.0–36.0)
MCV: 89.7 fL (ref 78.0–100.0)
Platelets: 106 10*3/uL — ABNORMAL LOW (ref 150–400)
RBC: 3.31 MIL/uL — AB (ref 3.87–5.11)
RDW: 16.5 % — ABNORMAL HIGH (ref 11.5–15.5)
WBC: 65.6 10*3/uL (ref 4.0–10.5)

## 2016-10-03 LAB — BASIC METABOLIC PANEL
Anion gap: 6 (ref 5–15)
BUN: 13 mg/dL (ref 6–20)
CHLORIDE: 105 mmol/L (ref 101–111)
CO2: 27 mmol/L (ref 22–32)
CREATININE: 0.84 mg/dL (ref 0.44–1.00)
Calcium: 8.6 mg/dL — ABNORMAL LOW (ref 8.9–10.3)
GFR calc Af Amer: >60 mL/min (ref >60)
GFR calc non Af Amer: >60 mL/min (ref >60)
Glucose, Bld: 226 mg/dL — ABNORMAL HIGH (ref 65–99)
POTASSIUM: 4.2 mmol/L (ref 3.5–5.1)
Sodium: 138 mmol/L (ref 135–145)

## 2016-10-03 LAB — HEMOGLOBIN A1C
Hgb A1c MFr Bld: 9 % — ABNORMAL HIGH (ref 4.8–5.6)
Mean Plasma Glucose: 212 mg/dL

## 2016-10-03 MED ORDER — INSULIN ASPART 100 UNIT/ML ~~LOC~~ SOLN: 0.0000 [IU] | Freq: Three times a day (TID) | SUBCUTANEOUS | 11 refills | Status: AC

## 2016-10-03 MED ORDER — INSULIN ASPART 100 UNIT/ML ~~LOC~~ SOLN: 5.0000 [IU] | Freq: Three times a day (TID) | SUBCUTANEOUS | 11 refills | Status: AC

## 2016-10-03 MED ORDER — NYSTATIN 100000 UNIT/GM EX POWD: Freq: Three times a day (TID) | CUTANEOUS | 0 refills | Status: DC

## 2016-10-03 MED ORDER — INSULIN GLARGINE 100 UNIT/ML ~~LOC~~ SOLN: 18.0000 [IU] | Freq: Every day | SUBCUTANEOUS | 11 refills | Status: DC

## 2016-10-03 MED ORDER — AMOXICILLIN 500 MG PO CAPS: 500.0000 mg | ORAL_CAPSULE | Freq: Two times a day (BID) | ORAL | Status: DC

## 2016-10-03 MED ORDER — GLIPIZIDE ER 5 MG PO TB24: 5.0000 mg | ORAL_TABLET | Freq: Every day | ORAL | Status: DC

## 2016-10-03 MED ORDER — FAMOTIDINE 10 MG PO TABS
10.0000 mg | ORAL_TABLET | Freq: Two times a day (BID) | ORAL | Status: AC
Start: 2016-10-03 — End: ?

## 2016-10-03 MED ORDER — METOPROLOL SUCCINATE ER 50 MG PO TB24: 50.0000 mg | ORAL_TABLET | Freq: Every day | ORAL | Status: DC

## 2016-10-03 NOTE — Progress Notes (Signed)
Report given nurse Arbie Cookey at [facility]. Patient's daughter at bedside with update. Awaiting for PTAR to disposition. All belongings send with family.   Ave Filter, RN

## 2016-10-03 NOTE — Progress Notes (Signed)
Physical Therapy Treatment Patient Details Name: Martha Walters MRN: [id number] DOB: [birth year] Today's Date: 10/03/2016    History of Present Illness Kateri Fontan is a 82 y.o. female with medical history significant of CLL, DM, HTN, on chronic coumadin for a DVT in leg a couple of years back.  Patient had mechanical fall at home, fell and struck head.  CT positive for 9 mm right frontal parenchymal hemorrhage and Incomplete right zygomaticomaxillary complex fracture.     PT Comments    Patient is making progress toward mobility goals. Less fearful of falling this session. Pt continues to demonstrate cognitive impairments limiting her mobility. Current plan remains appropriate.   Follow Up Recommendations  SNF     Equipment Recommendations  Rolling walker with 5" wheels    Recommendations for Other Services       Precautions / Restrictions Precautions Precautions: Fall Precaution Comments: daughter reports multiple falls lately, but this is worst    Mobility  Bed Mobility               General bed mobility comments: Pt OOB in chair upon arrival  Transfers Overall transfer level: Needs assistance Equipment used: Rolling walker (2 wheeled) Transfers: Sit to/from Stand Sit to Stand: Min assist         General transfer comment: assist to power up into standing; cues for safe hand placement  Ambulation/Gait Ambulation/Gait assistance: Mod assist Ambulation Distance (Feet): 100 Feet Assistive device: Rolling walker (2 wheeled) Gait Pattern/deviations: Step-through pattern;Decreased stride length;Shuffle;Trunk flexed     General Gait Details: multimodal cues for posture and proximity of RW; pt with shuffling gait at times and required cues for bilat heel strike; pt continues to demo impulsivity and has tendency to take hands off RW adn reach for objects around her; max directional cues especially for turning   Stairs            Wheelchair Mobility     Modified Rankin (Stroke Patients Only)       Balance     Sitting balance-Leahy Scale: Fair       Standing balance-Leahy Scale: Fair (staic stand; poor dynamic)                      Cognition Arousal/Alertness: Awake/alert Behavior During Therapy: WFL for tasks assessed/performed Overall Cognitive Status: History of cognitive impairments - at baseline                      Exercises      General Comments General comments (skin integrity, edema, etc.): pt less fearful of falling this session; daughter present for session; pt incontinent of urine on way to bathroom but recognized urge      Pertinent Vitals/Pain Pain Assessment: No/denies pain    Home Living                      Prior Function            PT Goals (current goals can now be found in the care plan section) Acute Rehab PT Goals Patient Stated Goal: none stated Progress towards PT goals: Progressing toward goals    Frequency    Min 3X/week      PT Plan Current plan remains appropriate    Co-evaluation             End of Session Equipment Utilized During Treatment: Gait belt Activity Tolerance: Patient tolerated treatment well Patient left: in chair;with call  bell/phone within reach;with chair alarm set;with family/visitor present     Time: [id number] PT Time Calculation (min) (ACUTE ONLY): 35 min  Charges:  $Gait Training: 8-22 mins $Therapeutic Activity: 8-22 mins                    G Codes:      Salina April, PTA Pager: [phone]   10/03/2016, 10:47 AM

## 2016-10-03 NOTE — Clinical Social Work Note (Signed)
Pt is ready for discharge today to [facility], which is pt and family's first choice. Facility is ready to admit pt as they have received discharge information. Pt and family are aware and agreeable to discharge plan. RN will call report. [facility] will provide transportation. CSW is signing off as no further needs.   Darden Dates, MSW, LCSW  Clinical Social Worker  [phone]

## 2016-10-03 NOTE — Progress Notes (Signed)
Critical lab value of WBC 65.6 received by Azucena Cecil, lab technician. MD notified via amnion.

## 2016-10-03 NOTE — Progress Notes (Signed)
Inpatient Diabetes Program Recommendations  AACE/ADA: New Consensus Statement on Inpatient Glycemic Control (2015)  Target Ranges:  Prepandial:   less than 140 mg/dL      Peak postprandial:   less than 180 mg/dL (1-2 hours)      Critically ill patients:  140 - 180 mg/dL   Lab Results  Component Value Date   GLUCAP 263 (H) 10/03/2016   HGBA1C 9.0 (H) 10/02/2016    Review of Glycemic Control Results for Martha Walters, Martha Walters (MRN [id number]) as of 10/03/2016 12:39  Ref. Range 10/02/2016 11:20 10/02/2016 16:17 10/02/2016 21:28 10/03/2016 06:54 10/03/2016 11:15  Glucose-Capillary Latest Ref Range: 65 - 99 mg/dL 332 (H) 217 (H) 200 (H) 216 (H) 263 (H)    Inpatient Diabetes Program Recommendations: Please consider increase in meal coverage to 7 units tid if eats 50%.  Thank you, Martha Walters. Martha Near, RN, MSN, CDE Inpatient Glycemic Control Team Team Pager [phone] (8am-5pm) 10/03/2016 1:12 PM

## 2016-10-03 NOTE — Clinical Social Work Placement (Signed)
   CLINICAL SOCIAL WORK PLACEMENT  NOTE  Date:  10/03/2016  Patient Details  Name: Martha Walters MRN: [id number] Date of Birth: Apr 02, 1929  Clinical Social Work is seeking post-discharge placement for this patient at the [place] level of care (*CSW will initial, date and re-position this form in  chart as items are completed):  Yes   Patient/family provided with [facility] Work Department's list of facilities offering this level of care within the geographic area requested by the patient (or if unable, by the patient's family).  Yes   Patient/family informed of their freedom to choose among providers that offer the needed level of care, that participate in Medicare, Medicaid or managed care program needed by the patient, have an available bed and are willing to accept the patient.  Yes   Patient/family informed of [facility] ownership interest in [facility] and [facility], as well as of the fact that they are under no obligation to receive care at these facilities.  PASRR submitted to EDS on 10/01/16     PASRR number received on 10/01/16     Existing PASRR number confirmed on       FL2 transmitted to all facilities in geographic area requested by pt/family on 10/01/16     FL2 transmitted to all facilities within larger geographic area on       Patient informed that his/her managed care company has contracts with or will negotiate with certain facilities, including the following:        Yes   Patient/family informed of bed offers received.  Patient chooses bed at [facility]     Physician recommends and patient chooses bed at      Patient to be transferred to [facility] on 10/03/16.  Patient to be transferred to facility by [facility]     Patient family notified on 10/03/16 of transfer.  Name of family member notified:  Pt's daughter, [name]     PHYSICIAN       Additional Comment:     _______________________________________________ Darden Dates, LCSW 10/03/2016, 12:17 PM

## 2016-10-03 NOTE — Discharge Summary (Signed)
Physician Discharge Summary   Patient ID: Martha Walters MRN: [id number] DOB/AGE: [birth year] 82 y.o.  Admit date: 09/30/2016 Discharge date: 10/03/2016  Primary Care Physician:  Martha Spanish, MD  Discharge Diagnoses:    . SDH (subdural hematoma) ([place]) . HTN (hypertension)    Uncontrolled diabetes mellitus    Right zygomaticomaxillary facial fracture    Right facial hematoma/right supraorbital laceration   Essential hypertension    Hypothyroidism   Consults:   ENT  Recommendations for Outpatient Follow-up:  1. Fall precautions 2. Please repeat CBC/BMET at next visit 3. Hold Coumadin for 2 weeks   DIET: Carb modified diet    Allergies:   Allergies  Allergen Reactions  . Phenergan [Promethazine Hcl] Other (See Comments)    Over Sedation  . Tetanus Toxoid Swelling  . Levaquin [Levofloxacin In D5w] Hives  . Levofloxacin Hives     DISCHARGE MEDICATIONS: Current Discharge Medication List    START taking these medications   Details  amoxicillin (AMOXIL) 500 MG capsule Take 1 capsule (500 mg total) by mouth every 12 (twelve) hours. X 10days    famotidine (PEPCID) 10 MG tablet Take 1 tablet (10 mg total) by mouth 2 (two) times daily.    !! insulin aspart (NOVOLOG) 100 UNIT/ML injection Inject 0-9 Units into the skin 3 (three) times daily with meals. Sliding scale CBG 70 - 120: 0 units CBG 121 - 150: 1 unit,  CBG 151 - 200: 2 units,  CBG 201 - 250: 3 units,  CBG 251 - 300: 5 units,  CBG 301 - 350: 7 units,  CBG 351 - 400: 9 units   CBG > 400: 9 units and notify your MD Qty: 10 mL, Refills: 11    !! insulin aspart (NOVOLOG) 100 UNIT/ML injection Inject 5 Units into the skin 3 (three) times daily with meals. Qty: 10 mL, Refills: 11    nystatin (MYCOSTATIN/NYSTOP) powder Apply topically 3 (three) times daily. Qty: 15 g, Refills: 0     !! - Potential duplicate medications found. Please discuss with provider.    CONTINUE these medications which have CHANGED    Details  insulin glargine (LANTUS) 100 UNIT/ML injection Inject 0.18 mLs (18 Units total) into the skin at bedtime. Qty: 10 mL, Refills: 11    metoprolol succinate (TOPROL-XL) 50 MG 24 hr tablet Take 1 tablet (50 mg total) by mouth at bedtime.      CONTINUE these medications which have NOT CHANGED   Details  allopurinol (ZYLOPRIM) 100 MG tablet Take 100 mg by mouth every morning.     citalopram (CELEXA) 10 MG tablet Take 10 mg by mouth every evening.     LEUKERAN 2 MG tablet Take 2 mg by mouth See admin instructions. Take 2 mg by mouth on Monday, Wednesday, Friday and Sunday.    levothyroxine (SYNTHROID, LEVOTHROID) 200 MCG tablet Take 200 mcg by mouth daily before breakfast.    Multiple Vitamins-Minerals (CENTRUM SILVER PO) Take 1 tablet by mouth daily.    omega-3 acid ethyl esters (LOVAZA) 1 G capsule Take 1 g by mouth 2 (two) times daily.     OVER THE COUNTER MEDICATION Take 2 capsules by mouth 2 (two) times daily. Sesame Seed Oil    ranitidine (ZANTAC) 75 MG tablet Take 75 mg by mouth 2 (two) times daily.    Red Yeast Rice 600 MG CAPS Take 600 mg by mouth 2 (two) times daily.      STOP taking these medications     warfarin (COUMADIN)  5 MG tablet      glipiZIDE (GLUCOTROL XL) 5 MG 24 hr tablet          Brief H and P: For complete details please refer to admission H and P, but in brief82 year old female with history of CLL, diabetes mellitus on oral hypoglycemic, hypertension, chronic Coumadin for DVT of leg since 2 years back presented to [facility] ED after sustaining a fall while using her walker. She was found to have subdural hematoma and minimally displaced fracture of body of right zygoma and right orbital floor.  Hospital Course:   SDH (subdural hematoma) (HCC) Secondary to fall. Stable on repeat CT. No focal neurological deficit except for some confusion. Neurosurgery recommended to hold Coumadin for 2 weeks and no repeat imaging needed. Keep head of bed  elevated for another 2 nights, no nose blowing for 2 weeks. Continue Augmentin and oxygen for 10 day course. Seen by physical therapy and recommended SNF.    DM2 (diabetes mellitus, type 2), uncontrolled (HCC) CBG in 300s-400s during hospitalization. Only on glipizide at home.  Hemoglobin A1c 9.0, patient was restarted on Lantus 18 units at bedtime and meal coverage with sliding scale insulin. Glipizide was discontinued to avoid hypoglycemia with the insulin regimen on board.  Right facial fracture Incomplete right zygomaticomaxillary complex fracture. No pain or visual disturbance. Appreciate ENT evaluation. Continue empiric amoxicillin for 10 days.  Outpatient follow-up with ophthalmologist (Dr Martha Walters). Follow up with ENT as needed.  Right facial hematoma/right supraorbital laceration Swelling better. Laceration not sutured and recommend heating by secondary intention.  Essential hypertension Blood pressure elevated. Increase Toprol to 50 mg daily.   Hypothyroidism Continue Synthroid.   CLL Being monitored by Dr. Theora Walters at [facility].  Day of Discharge BP (!) 150/59 (BP Location: Right Arm)   Pulse 79   Temp 98.7 F (37.1 C) (Oral)   Resp 20   Ht 5\' 8"  (1.727 m)   Wt 84 kg (185 lb 3 oz)   SpO2 96%   BMI 28.16 kg/m   Physical Exam: General: Alert and awake oriented x3 not in any acute distress. HEENT: right side of the face with bruising, PERLA  CVS: S1-S2 clear no murmur rubs or gallops Chest: clear to auscultation bilaterally, no wheezing rales or rhonchi Abdomen: soft nontender, nondistended, normal bowel sounds Extremities: no cyanosis, clubbing or edema noted bilaterally Neuro: Cranial nerves II-XII intact, no focal neurological deficits   The results of significant diagnostics from this hospitalization (including imaging, microbiology, ancillary and laboratory) are listed below for reference.    LAB RESULTS: Basic Metabolic Panel:  Recent Labs Lab  10/02/16 0646 10/03/16 0622  NA 138 138  K 4.0 4.2  CL 104 105  CO2 25 27  GLUCOSE 253* 226*  BUN 11 13  CREATININE 0.81 0.84  CALCIUM 8.6* 8.6*   Liver Function Tests: No results for input(s): AST, ALT, ALKPHOS, BILITOT, PROT, ALBUMIN in the last 168 hours. No results for input(s): LIPASE, AMYLASE in the last 168 hours.  Recent Labs Lab 10/01/16 1405  AMMONIA 16   CBC:  Recent Labs Lab 10/02/16 0646 10/03/16 0622  WBC 54.5* 65.6*  HGB 9.4* 9.2*  HCT 30.2* 29.7*  MCV 89.6 89.7  PLT 107* 106*   Cardiac Enzymes: No results for input(s): CKTOTAL, CKMB, CKMBINDEX, TROPONINI in the last 168 hours. BNP: Invalid input(s): POCBNP CBG:  Recent Labs Lab 10/03/16 0654 10/03/16 1115  GLUCAP 216* 263*    Significant Diagnostic Studies:  Ct Head Wo Contrast  Result Date: 09/30/2016 CLINICAL DATA:  Fall yesterday.  Followup subdural hematoma. EXAM: CT HEAD WITHOUT CONTRAST TECHNIQUE: Contiguous axial images were obtained from the base of the skull through the vertex without intravenous contrast. COMPARISON:  [facility] from yesterday. FINDINGS: Brain: Unchanged 5 x 9 mm parenchymal hemorrhage in the subcortical right frontal lobe anteriorly. Thin rim of surrounding edema is present. No other site of intracranial hemorrhage, including visible subdural hematoma. Generalized atrophy. Chronic small vessel ischemic change in the cerebral white matter. Vascular: Atherosclerotic calcification. Skull: Negative Sinuses/Orbits: Incomplete right zygomaticomaxillary complex fractures, sparing the lateral orbital rim and zygoma. Right maxillary hemo sinus. Stable mild extraconal hemorrhage on the right orbital floor with mild proptosis. Bilateral cataract resection. IMPRESSION: 1. Stable 9 mm right frontal parenchymal hemorrhage. 2. Incomplete right zygomaticomaxillary complex fracture as described. Electronically Signed   By: Monte Fantasia M.D.   On: 09/30/2016 08:57    2D  ECHO:   Disposition and Follow-up: Discharge Instructions    Diet - low sodium heart healthy    Complete by:  As directed    Increase activity slowly    Complete by:  As directed        DISPOSITION:  SNF   DISCHARGE FOLLOW-UP Follow-up Information    Martha Spanish, MD. Schedule an appointment as soon as possible for a visit in 2 week(s).   Specialty:  Internal Medicine Contact information: [place] [address] [phone]        Linton Rump, MD. Schedule an appointment as soon as possible for a visit in 2 week(s).   Specialty:  Ophthalmology Contact information: [address] [place] [address] [phone]        Jodi Marble, MD. Schedule an appointment as soon as possible for a visit in 2 week(s).   Specialty:  Otolaryngology Contact information: [address] [place] [address] [phone]            Time spent on Discharge: 32mins   Signed:   Jacarius Handel M.D. [facility] 10/03/2016, 11:41 AM Pager: [phone]

## 2016-10-03 NOTE — Clinical Social Work Note (Signed)
Clinical Social Work Assessment  Patient Details  Name: Martha Walters MRN: [id number] Date of Birth: 1929/07/18  Date of referral:  10/01/16               Reason for consult:  Facility Placement, Discharge Planning                Permission sought to share information with:  Family Supports Permission granted to share information::  Yes, Verbal Permission Granted  Name::     [name]  Relationship::  daughter  Housing/Transportation Living arrangements for the past 2 months:  Single Family Home Source of Information:  Patient Patient Interpreter Needed:  None Criminal Activity/Legal Involvement Pertinent to Current Situation/Hospitalization:  No - Comment as needed Significant Relationships:  Adult Children, Spouse Lives with:  Spouse Do you feel safe going back to the place where you live?  Yes Need for family participation in patient care:  Yes (Comment)  Care giving concerns:  No care giving concerns identififed.   Social Worker assessment / plan:  CSW met with pt and daughter to address consult for SNF. Pt was sleeping soundly during assessment. CSW introduced herself and explained role of social work. CSW also explained the process of discharging to SNF with Medicare. PT is recommending SNF for STR. CSW will initiated SNF search and follow up with bed offers. CSW will continue to follow.   Employment status:  Retired Forensic scientist:  Medicare PT Recommendations:   SNF Information / Referral to community resources:  [facility]  Patient/Family's Response to care:  Pt's daughter was Patent attorney of CSW support.   Patient/Family's Understanding of and Emotional Response to Diagnosis, Current Treatment, and Prognosis:  Pt's family understands that pt would benefit from STR at [facility].  Emotional Assessment Appearance:  Appears stated age Attitude/Demeanor/Rapport:  Other Affect (typically observed):  Other Orientation:  Oriented to Self, Oriented to Place, Oriented  to  Time, Oriented to Situation Alcohol / Substance use:  Never Used Psych involvement (Current and /or in the community):  No (Comment)  Discharge Needs  Concerns to be addressed:  Adjustment to Illness Readmission within the last 30 days:  No Current discharge risk:  None Barriers to Discharge:  Continued Medical Work up   [employer], LCSW 10/03/2016, 10:11 AM

## 2017-02-28 ENCOUNTER — Ambulatory Visit: Payer: Medicare Other | Admitting: Sports Medicine

## 2017-03-21 ENCOUNTER — Ambulatory Visit: Payer: Medicare Other | Admitting: Sports Medicine

## 2017-10-29 DIAGNOSIS — R05 Cough: Secondary | ICD-10-CM | POA: Diagnosis not present

## 2017-10-29 DIAGNOSIS — R0602 Shortness of breath: Secondary | ICD-10-CM

## 2017-10-30 DIAGNOSIS — E039 Hypothyroidism, unspecified: Secondary | ICD-10-CM | POA: Diagnosis not present

## 2017-10-30 DIAGNOSIS — R05 Cough: Secondary | ICD-10-CM | POA: Diagnosis not present

## 2017-10-30 DIAGNOSIS — J9601 Acute respiratory failure with hypoxia: Secondary | ICD-10-CM

## 2017-10-30 DIAGNOSIS — R0602 Shortness of breath: Secondary | ICD-10-CM | POA: Diagnosis not present

## 2017-10-30 DIAGNOSIS — J189 Pneumonia, unspecified organism: Secondary | ICD-10-CM

## 2017-10-31 DIAGNOSIS — E039 Hypothyroidism, unspecified: Secondary | ICD-10-CM | POA: Diagnosis not present

## 2017-10-31 DIAGNOSIS — J189 Pneumonia, unspecified organism: Secondary | ICD-10-CM | POA: Diagnosis not present

## 2017-10-31 DIAGNOSIS — R05 Cough: Secondary | ICD-10-CM | POA: Diagnosis not present

## 2017-10-31 DIAGNOSIS — J9601 Acute respiratory failure with hypoxia: Secondary | ICD-10-CM | POA: Diagnosis not present

## 2017-10-31 DIAGNOSIS — R0602 Shortness of breath: Secondary | ICD-10-CM | POA: Diagnosis not present

## 2017-11-01 DIAGNOSIS — J189 Pneumonia, unspecified organism: Secondary | ICD-10-CM | POA: Diagnosis not present

## 2017-11-01 DIAGNOSIS — R05 Cough: Secondary | ICD-10-CM | POA: Diagnosis not present

## 2017-11-01 DIAGNOSIS — R0602 Shortness of breath: Secondary | ICD-10-CM | POA: Diagnosis not present

## 2017-11-01 DIAGNOSIS — E039 Hypothyroidism, unspecified: Secondary | ICD-10-CM | POA: Diagnosis not present

## 2017-11-01 DIAGNOSIS — J9601 Acute respiratory failure with hypoxia: Secondary | ICD-10-CM | POA: Diagnosis not present

## 2017-11-02 DIAGNOSIS — E039 Hypothyroidism, unspecified: Secondary | ICD-10-CM | POA: Diagnosis not present

## 2017-11-02 DIAGNOSIS — J9601 Acute respiratory failure with hypoxia: Secondary | ICD-10-CM | POA: Diagnosis not present

## 2017-11-02 DIAGNOSIS — J189 Pneumonia, unspecified organism: Secondary | ICD-10-CM | POA: Diagnosis not present

## 2017-11-02 DIAGNOSIS — R0602 Shortness of breath: Secondary | ICD-10-CM | POA: Diagnosis not present

## 2017-11-02 DIAGNOSIS — R05 Cough: Secondary | ICD-10-CM | POA: Diagnosis not present

## 2017-11-03 DIAGNOSIS — J189 Pneumonia, unspecified organism: Secondary | ICD-10-CM | POA: Diagnosis not present

## 2017-11-03 DIAGNOSIS — E039 Hypothyroidism, unspecified: Secondary | ICD-10-CM | POA: Diagnosis not present

## 2017-11-03 DIAGNOSIS — R05 Cough: Secondary | ICD-10-CM | POA: Diagnosis not present

## 2017-11-03 DIAGNOSIS — J9601 Acute respiratory failure with hypoxia: Secondary | ICD-10-CM | POA: Diagnosis not present

## 2017-11-03 DIAGNOSIS — R0602 Shortness of breath: Secondary | ICD-10-CM | POA: Diagnosis not present

## 2017-11-04 DIAGNOSIS — J9601 Acute respiratory failure with hypoxia: Secondary | ICD-10-CM | POA: Diagnosis not present

## 2017-11-04 DIAGNOSIS — R05 Cough: Secondary | ICD-10-CM | POA: Diagnosis not present

## 2017-11-04 DIAGNOSIS — E039 Hypothyroidism, unspecified: Secondary | ICD-10-CM | POA: Diagnosis not present

## 2017-11-04 DIAGNOSIS — J189 Pneumonia, unspecified organism: Secondary | ICD-10-CM | POA: Diagnosis not present

## 2017-11-04 DIAGNOSIS — R0602 Shortness of breath: Secondary | ICD-10-CM | POA: Diagnosis not present

## 2018-02-21 ENCOUNTER — Inpatient Hospital Stay (HOSPITAL_COMMUNITY): Payer: Medicare Other

## 2018-02-21 ENCOUNTER — Emergency Department (HOSPITAL_COMMUNITY): Payer: Medicare Other

## 2018-02-21 ENCOUNTER — Inpatient Hospital Stay (HOSPITAL_COMMUNITY)
Admission: EM | Admit: 2018-02-21 | Discharge: 2018-02-27 | DRG: 064 | Disposition: A | Discharge status: Skilled Nursing Facility | Payer: Medicare Other | Attending: Family Medicine | Admitting: Family Medicine

## 2018-02-21 ENCOUNTER — Encounter (HOSPITAL_COMMUNITY): Payer: Self-pay | Admitting: *Deleted

## 2018-02-21 ENCOUNTER — Other Ambulatory Visit: Payer: Self-pay

## 2018-02-21 DIAGNOSIS — I611 Nontraumatic intracerebral hemorrhage in hemisphere, cortical: Secondary | ICD-10-CM | POA: Diagnosis present

## 2018-02-21 DIAGNOSIS — D696 Thrombocytopenia, unspecified: Secondary | ICD-10-CM | POA: Diagnosis present

## 2018-02-21 DIAGNOSIS — I4891 Unspecified atrial fibrillation: Secondary | ICD-10-CM | POA: Diagnosis present

## 2018-02-21 DIAGNOSIS — Z794 Long term (current) use of insulin: Secondary | ICD-10-CM

## 2018-02-21 DIAGNOSIS — I10 Essential (primary) hypertension: Secondary | ICD-10-CM | POA: Diagnosis present

## 2018-02-21 DIAGNOSIS — Z79890 Hormone replacement therapy (postmenopausal): Secondary | ICD-10-CM

## 2018-02-21 DIAGNOSIS — M109 Gout, unspecified: Secondary | ICD-10-CM | POA: Diagnosis present

## 2018-02-21 DIAGNOSIS — E039 Hypothyroidism, unspecified: Secondary | ICD-10-CM | POA: Diagnosis present

## 2018-02-21 DIAGNOSIS — R29810 Facial weakness: Secondary | ICD-10-CM | POA: Diagnosis not present

## 2018-02-21 DIAGNOSIS — R402142 Coma scale, eyes open, spontaneous, at arrival to emergency department: Secondary | ICD-10-CM | POA: Diagnosis present

## 2018-02-21 DIAGNOSIS — F0390 Unspecified dementia without behavioral disturbance: Secondary | ICD-10-CM | POA: Diagnosis present

## 2018-02-21 DIAGNOSIS — R1312 Dysphagia, oropharyngeal phase: Secondary | ICD-10-CM | POA: Diagnosis present

## 2018-02-21 DIAGNOSIS — I447 Left bundle-branch block, unspecified: Secondary | ICD-10-CM | POA: Diagnosis present

## 2018-02-21 DIAGNOSIS — E871 Hypo-osmolality and hyponatremia: Secondary | ICD-10-CM | POA: Diagnosis present

## 2018-02-21 DIAGNOSIS — R1314 Dysphagia, pharyngoesophageal phase: Secondary | ICD-10-CM | POA: Diagnosis not present

## 2018-02-21 DIAGNOSIS — R402252 Coma scale, best verbal response, oriented, at arrival to emergency department: Secondary | ICD-10-CM | POA: Diagnosis present

## 2018-02-21 DIAGNOSIS — C9110 Chronic lymphocytic leukemia of B-cell type not having achieved remission: Secondary | ICD-10-CM | POA: Diagnosis present

## 2018-02-21 DIAGNOSIS — R531 Weakness: Secondary | ICD-10-CM

## 2018-02-21 DIAGNOSIS — R4182 Altered mental status, unspecified: Secondary | ICD-10-CM | POA: Diagnosis not present

## 2018-02-21 DIAGNOSIS — E1151 Type 2 diabetes mellitus with diabetic peripheral angiopathy without gangrene: Secondary | ICD-10-CM | POA: Diagnosis present

## 2018-02-21 DIAGNOSIS — I440 Atrioventricular block, first degree: Secondary | ICD-10-CM | POA: Diagnosis present

## 2018-02-21 DIAGNOSIS — R791 Abnormal coagulation profile: Secondary | ICD-10-CM | POA: Diagnosis present

## 2018-02-21 DIAGNOSIS — J189 Pneumonia, unspecified organism: Secondary | ICD-10-CM

## 2018-02-21 DIAGNOSIS — Z7901 Long term (current) use of anticoagulants: Secondary | ICD-10-CM

## 2018-02-21 DIAGNOSIS — I6330 Cerebral infarction due to thrombosis of unspecified cerebral artery: Secondary | ICD-10-CM

## 2018-02-21 DIAGNOSIS — R29702 NIHSS score 2: Secondary | ICD-10-CM | POA: Diagnosis present

## 2018-02-21 DIAGNOSIS — R404 Transient alteration of awareness: Secondary | ICD-10-CM

## 2018-02-21 DIAGNOSIS — Z515 Encounter for palliative care: Secondary | ICD-10-CM

## 2018-02-21 DIAGNOSIS — I495 Sick sinus syndrome: Secondary | ICD-10-CM | POA: Diagnosis present

## 2018-02-21 DIAGNOSIS — Z79899 Other long term (current) drug therapy: Secondary | ICD-10-CM

## 2018-02-21 DIAGNOSIS — Z8701 Personal history of pneumonia (recurrent): Secondary | ICD-10-CM

## 2018-02-21 DIAGNOSIS — Z86718 Personal history of other venous thrombosis and embolism: Secondary | ICD-10-CM

## 2018-02-21 DIAGNOSIS — R1310 Dysphagia, unspecified: Secondary | ICD-10-CM

## 2018-02-21 DIAGNOSIS — Z66 Do not resuscitate: Secondary | ICD-10-CM

## 2018-02-21 DIAGNOSIS — R402362 Coma scale, best motor response, obeys commands, at arrival to emergency department: Secondary | ICD-10-CM | POA: Diagnosis present

## 2018-02-21 DIAGNOSIS — Z888 Allergy status to other drugs, medicaments and biological substances status: Secondary | ICD-10-CM

## 2018-02-21 DIAGNOSIS — Z887 Allergy status to serum and vaccine status: Secondary | ICD-10-CM

## 2018-02-21 DIAGNOSIS — I639 Cerebral infarction, unspecified: Secondary | ICD-10-CM | POA: Diagnosis present

## 2018-02-21 LAB — URINALYSIS, ROUTINE W REFLEX MICROSCOPIC
Bilirubin Urine: NEGATIVE
Glucose, UA: NEGATIVE mg/dL
Hgb urine dipstick: NEGATIVE
Ketones, ur: NEGATIVE mg/dL
Leukocytes, UA: NEGATIVE
Nitrite: NEGATIVE
Protein, ur: NEGATIVE mg/dL
Specific Gravity, Urine: 1.009 (ref 1.005–1.030)
pH: 7 (ref 5.0–8.0)

## 2018-02-21 LAB — I-STAT CHEM 8, ED
BUN: 12 mg/dL (ref 6–20)
CHLORIDE: 99 mmol/L — AB (ref 101–111)
Calcium, Ion: 1.17 mmol/L (ref 1.15–1.40)
Creatinine, Ser: 0.7 mg/dL (ref 0.44–1.00)
Glucose, Bld: 132 mg/dL — ABNORMAL HIGH (ref 65–99)
HEMATOCRIT: 36 % (ref 36.0–46.0)
Hemoglobin: 12.2 g/dL (ref 12.0–15.0)
POTASSIUM: 4 mmol/L (ref 3.5–5.1)
SODIUM: 136 mmol/L (ref 135–145)
TCO2: 26 mmol/L (ref 22–32)

## 2018-02-21 LAB — COMPREHENSIVE METABOLIC PANEL
ALBUMIN: 3.5 g/dL (ref 3.5–5.0)
ALT: 26 U/L (ref 14–54)
ANION GAP: 8 (ref 5–15)
AST: 38 U/L (ref 15–41)
Alkaline Phosphatase: 91 U/L (ref 38–126)
BILIRUBIN TOTAL: 0.8 mg/dL (ref 0.3–1.2)
BUN: 12 mg/dL (ref 6–20)
CHLORIDE: 101 mmol/L (ref 101–111)
CO2: 25 mmol/L (ref 22–32)
Calcium: 9 mg/dL (ref 8.9–10.3)
Creatinine, Ser: 0.79 mg/dL (ref 0.44–1.00)
GFR calc Af Amer: >60 mL/min (ref >60)
GFR calc non Af Amer: >60 mL/min (ref >60)
GLUCOSE: 137 mg/dL — AB (ref 65–99)
POTASSIUM: 4.7 mmol/L (ref 3.5–5.1)
SODIUM: 134 mmol/L — AB (ref 135–145)
TOTAL PROTEIN: 5.7 g/dL — AB (ref 6.5–8.1)

## 2018-02-21 LAB — CBC
HCT: 36.5 % (ref 36.0–46.0)
Hemoglobin: 11.9 g/dL — ABNORMAL LOW (ref 12.0–15.0)
MCH: 30.1 pg (ref 26.0–34.0)
MCHC: 32.6 g/dL (ref 30.0–36.0)
MCV: 92.4 fL (ref 78.0–100.0)
Platelets: 56 10*3/uL — ABNORMAL LOW (ref 150–400)
RBC: 3.95 MIL/uL (ref 3.87–5.11)
RDW: 16.2 % — ABNORMAL HIGH (ref 11.5–15.5)
WBC: 77.5 10*3/uL (ref 4.0–10.5)

## 2018-02-21 LAB — DIFFERENTIAL
Lymphocytes Relative: 95 %
Lymphs Abs: 73.6 10*3/uL — ABNORMAL HIGH (ref 0.7–4.0)
Monocytes Absolute: 1.1 10*3/uL — ABNORMAL HIGH (ref 0.1–1.0)
Monocytes Relative: 2 %
NEUTROS ABS: 2.3 10*3/uL (ref 1.7–7.7)
Neutrophils Relative %: 3 %

## 2018-02-21 LAB — I-STAT TROPONIN, ED: Troponin i, poc: 0 ng/mL (ref 0.00–0.08)

## 2018-02-21 LAB — APTT: aPTT: 27 s (ref 24–36)

## 2018-02-21 LAB — CBG MONITORING, ED
GLUCOSE-CAPILLARY: 121 mg/dL — AB (ref 65–99)
GLUCOSE-CAPILLARY: 118 mg/dL — AB (ref 65–99)

## 2018-02-21 LAB — PROTIME-INR
INR: 1.47
Prothrombin Time: 17.7 s — ABNORMAL HIGH (ref 11.4–15.2)

## 2018-02-21 MED ORDER — CITALOPRAM HYDROBROMIDE 10 MG PO TABS
10.0000 mg | ORAL_TABLET | Freq: Every evening | ORAL | Status: DC
  Administered 2018-02-22 – 2018-02-26 (×5): 10 mg via ORAL
  Filled 2018-02-21 (×5): qty 1

## 2018-02-21 MED ORDER — CEFTRIAXONE SODIUM 1 G IJ SOLR
1.0000 g | INTRAMUSCULAR | Status: DC
  Administered 2018-02-21 – 2018-02-22 (×2): 1 g via INTRAVENOUS
  Filled 2018-02-21 (×2): qty 10

## 2018-02-21 MED ORDER — ACETAMINOPHEN 325 MG PO TABS: 650.0000 mg | ORAL_TABLET | ORAL | Status: DC | PRN

## 2018-02-21 MED ORDER — ONDANSETRON HCL 4 MG/2ML IJ SOLN
4.0000 mg | Freq: Once | INTRAMUSCULAR | Status: AC
  Administered 2018-02-21: 4 mg via INTRAVENOUS
  Filled 2018-02-21: qty 2

## 2018-02-21 MED ORDER — SODIUM CHLORIDE 0.9 % IV SOLN
INTRAVENOUS | Status: AC
  Administered 2018-02-21 – 2018-02-22 (×2): via INTRAVENOUS

## 2018-02-21 MED ORDER — ACETAMINOPHEN 650 MG RE SUPP: 650.0000 mg | RECTAL | Status: DC | PRN

## 2018-02-21 MED ORDER — ONDANSETRON HCL 4 MG/2ML IJ SOLN
4.0000 mg | Freq: Four times a day (QID) | INTRAMUSCULAR | Status: DC | PRN
  Administered 2018-02-21 – 2018-02-23 (×3): 4 mg via INTRAVENOUS
  Filled 2018-02-21 (×2): qty 2

## 2018-02-21 MED ORDER — ONDANSETRON HCL 4 MG/2ML IJ SOLN
4.0000 mg | Freq: Four times a day (QID) | INTRAMUSCULAR | Status: DC | PRN
  Filled 2018-02-21: qty 2

## 2018-02-21 MED ORDER — ALLOPURINOL 100 MG PO TABS
100.0000 mg | ORAL_TABLET | ORAL | Status: DC
  Administered 2018-02-23 – 2018-02-27 (×5): 100 mg via ORAL
  Filled 2018-02-21 (×6): qty 1

## 2018-02-21 MED ORDER — FAMOTIDINE 10 MG PO TABS: 10.0000 mg | ORAL_TABLET | Freq: Every day | ORAL | Status: DC

## 2018-02-21 MED ORDER — STROKE: EARLY STAGES OF RECOVERY BOOK
Freq: Once | Status: AC
  Administered 2018-02-21: 22:00:00
  Filled 2018-02-21: qty 1

## 2018-02-21 MED ORDER — LEVOTHYROXINE SODIUM 100 MCG PO TABS
200.0000 ug | ORAL_TABLET | Freq: Every day | ORAL | Status: DC
  Administered 2018-02-23 – 2018-02-27 (×5): 200 ug via ORAL
  Filled 2018-02-21 (×2): qty 2
  Filled 2018-02-21: qty 1
  Filled 2018-02-21 (×3): qty 2

## 2018-02-21 MED ORDER — SODIUM CHLORIDE 0.9 % IV SOLN
500.0000 mg | INTRAVENOUS | Status: DC
  Administered 2018-02-21 – 2018-02-22 (×2): 500 mg via INTRAVENOUS
  Filled 2018-02-21 (×3): qty 500

## 2018-02-21 MED ORDER — ACETAMINOPHEN 160 MG/5ML PO SOLN: 650.0000 mg | ORAL | Status: DC | PRN

## 2018-02-21 MED ORDER — ASPIRIN 300 MG RE SUPP
300.0000 mg | Freq: Every day | RECTAL | Status: DC
  Administered 2018-02-21 – 2018-02-22 (×2): 300 mg via RECTAL
  Filled 2018-02-21 (×2): qty 1

## 2018-02-21 MED ORDER — ASPIRIN 325 MG PO TABS
325.0000 mg | ORAL_TABLET | Freq: Every day | ORAL | Status: DC
  Administered 2018-02-23 – 2018-02-24 (×2): 325 mg via ORAL
  Filled 2018-02-21 (×2): qty 1

## 2018-02-21 NOTE — ED Notes (Signed)
Attempted to call report

## 2018-02-21 NOTE — ED Notes (Signed)
Canceled code stroke 

## 2018-02-21 NOTE — ED Notes (Signed)
Speech therapist at bedside doing swallow study.

## 2018-02-21 NOTE — Evaluation (Signed)
Clinical/Bedside Swallow Evaluation Patient Details  Name: Martha Walters MRN: [id number] Date of Birth: 07/19/1929  Today's Date: 02/21/2018 Time: SLP Start Time (ACUTE ONLY): 1800 SLP Stop Time (ACUTE ONLY): 1810 SLP Time Calculation (min) (ACUTE ONLY): 10 min  Past Medical History:  Past Medical History:  Diagnosis Date  . Bruises easily   . Clotting disorder ([place])   . Diabetes mellitus without complication ([place])   . Hypertension   . Thyroid disease    Past Surgical History:  Past Surgical History:  Procedure Laterality Date  . trigger finger injection     HPI:  Martha Walters a 84 y.o.femalepresenting with AMS. 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 Per MD pt has had PNA recently.   Assessment / Plan / Recommendation Clinical Impression  Patient presents with clinical signs of aspiration; minimal hyolaryngeal movement appreciated to palpation. With single sip of thin water, pt presents with immediate, wet coughing, suggestive of decreased airway protection. Suspect both left facial weakness and pharyngeal impairments contributing to airway compromise. Recommend she remain NPO; SLP will follow up at bedside next date to determine readiness for instrumental testing. Discussed with pt and daughter who are in agreement.    SLP Visit Diagnosis: Dysphagia, oropharyngeal phase (R13.12)    Aspiration Risk  Moderate aspiration risk    Diet Recommendation NPO        Other  Recommendations Oral Care Recommendations: Oral care QID   Follow up Recommendations Other (comment)(tbd)      Frequency and Duration min 2x/week  2 weeks       Prognosis Prognosis for Safe Diet Advancement: (will determine with instrumental assessment) Barriers to Reach Goals: Cognitive deficits      Swallow Study   General Date of Onset:  02/21/18 HPI: Martha Walters a 84 y.o.femalepresenting with AMS. 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 Per MD pt has had PNA recently. Type of Study: Bedside Swallow Evaluation Previous Swallow Assessment: none in chart Diet Prior to this Study: NPO Temperature Spikes Noted: No Respiratory Status: Nasal cannula History of Recent Intubation: No Behavior/Cognition: Alert;Cooperative;Pleasant mood Oral Cavity Assessment: Within Functional Limits Oral Care Completed by SLP: No Oral Cavity - Dentition: Dentures, top;Dentures, bottom Vision: Functional for self-feeding Self-Feeding Abilities: Able to feed self Patient Positioning: Upright in bed Baseline Vocal Quality: Wet Volitional Cough: Cognitively unable to elicit Volitional Swallow: Unable to elicit    Oral/Motor/Sensory Function Overall Oral Motor/Sensory Function: Mild impairment Facial ROM: Reduced left;Suspected CN VII (facial) dysfunction Facial Symmetry: Abnormal symmetry left;Suspected CN VII (facial) dysfunction Lingual ROM: Within Functional Limits Lingual Symmetry: Within Functional Limits Lingual Strength: Within Functional Limits Mandible: Within Functional Limits   Ice Chips Ice chips: Impaired Presentation: Spoon Oral Phase Impairments: Reduced labial seal Oral Phase Functional Implications: Prolonged oral transit;Oral holding Pharyngeal Phase Impairments: Suspected delayed Swallow;Decreased hyoid-laryngeal movement   Thin Liquid Thin Liquid: Impaired Presentation: Cup Oral Phase Impairments: Reduced labial seal Oral Phase Functional Implications: Other (comment)(suspect premature spillage) Pharyngeal  Phase Impairments: Suspected delayed Swallow;Decreased hyoid-laryngeal movement;Cough - Immediate;Wet Vocal Quality    Nectar Thick Nectar Thick Liquid:  Not tested   Honey Thick Honey Thick Liquid: Not tested   Puree Puree: Not tested   Solid   GO   Solid: Not tested  Martha Walters, [place], CCC-SLP Speech-Language Pathologist [place] 02/21/2018,6:34 PM

## 2018-02-21 NOTE — Progress Notes (Signed)
FPTS Interim Note:   Called by family to discuss code status. Patient is demented and has no formal HCPOA. She has 3 children. Two daughters at bedside report they have discussed with their brother and are in concensus that the patient (they also asked her) should be DNR. Will change in chart. Also explained MRI results of acute stroke, and explained PT/OT/SLP and medical options to prevent future risk of stroke. Patient with desat while in room, placed back on O2, likely 2/2 CAP. SLP in to eval after my discussion.   Ralene Ok, MD

## 2018-02-21 NOTE — ED Notes (Signed)
Patient transported to MRI 

## 2018-02-21 NOTE — ED Notes (Signed)
Patient transported to X-ray 

## 2018-02-21 NOTE — Progress Notes (Signed)
Paged Lindell Noe, MD x2 and let her know about the emesis color. First was bright green and second one was golden yellow. MD placed order for Zofran. Pt already has been NPO.

## 2018-02-21 NOTE — ED Notes (Signed)
Patient denies pain and is resting comfortably.  

## 2018-02-21 NOTE — ED Provider Notes (Signed)
[facility] EMERGENCY DEPARTMENT Provider Note   CSN: [id number] Arrival date & time: 02/21/18  1042   An emergency department physician performed an initial assessment on this suspected stroke patient at 73.  History   Chief Complaint Chief Complaint  Patient presents with  . Code Stroke    HPI Shunna Mikaelian is a 84 y.o. female.  HPI   Patient is a 84 year old female presenting as a code stroke.  According to family patient was mildly altered  last night when she went to bed at 10 PM.  Woke up this morning much more altered.  Patient's best baseline is alert to self and self transferring from wheelchair to bed.  According family this morning she was unable to transfer by herself.  On arrival here patient is oriented toward self.  No focal neuro deficits.  Patient initially called code stroke.  However after normal CT, code stroke canceled.  Patient has nothing focal on exam, normal vital signs.  Physical exam reassuring.  Past Medical History:  Diagnosis Date  . Bruises easily   . Clotting disorder ([place])   . Diabetes mellitus without complication ([place])   . Hypertension   . Thyroid disease     Patient Active Problem List   Diagnosis Date Noted  . Closed fracture of zygomaticomaxillary complex ([place]) 10/01/2016  . Facial hematoma 10/01/2016  . Confusion 10/01/2016  . SDH (subdural hematoma) ([place]) 09/30/2016  . DM2 (diabetes mellitus, type 2) ([place]) 09/30/2016  . HTN (hypertension) 09/30/2016  . Chronic lymphocytic leukemia ([place]) 12/26/2015  . Acquired trigger finger 06/21/2014    Past Surgical History:  Procedure Laterality Date  . trigger finger injection      OB History    No data available       Home Medications    Prior to Admission medications   Medication Sig Start Date End Date Taking? Authorizing Provider  allopurinol (ZYLOPRIM) 100 MG tablet Take 100 mg by mouth every morning.  03/26/14   [provider]  amoxicillin  (AMOXIL) 500 MG capsule Take 1 capsule (500 mg total) by mouth every 12 (twelve) hours. X 10days 10/03/16   Rai, Ripudeep K, MD  citalopram (CELEXA) 10 MG tablet Take 10 mg by mouth every evening.  06/18/14   [provider]  famotidine (PEPCID) 10 MG tablet Take 1 tablet (10 mg total) by mouth 2 (two) times daily. 10/03/16   Rai, Ripudeep K, MD  insulin aspart (NOVOLOG) 100 UNIT/ML injection Inject 0-9 Units into the skin 3 (three) times daily with meals. Sliding scale CBG 70 - 120: 0 units CBG 121 - 150: 1 unit,  CBG 151 - 200: 2 units,  CBG 201 - 250: 3 units,  CBG 251 - 300: 5 units,  CBG 301 - 350: 7 units,  CBG 351 - 400: 9 units   CBG > 400: 9 units and notify your MD 10/03/16   Rai, Vernelle Emerald, MD  insulin aspart (NOVOLOG) 100 UNIT/ML injection Inject 5 Units into the skin 3 (three) times daily with meals. 10/03/16   Rai, Ripudeep K, MD  insulin glargine (LANTUS) 100 UNIT/ML injection Inject 0.18 mLs (18 Units total) into the skin at bedtime. 10/03/16   Rai, Ripudeep Raliegh Ip, MD  LEUKERAN 2 MG tablet Take 2 mg by mouth See admin instructions. Take 2 mg by mouth on Monday, Wednesday, Friday and Sunday. 04/12/14   [provider]  levothyroxine (SYNTHROID, LEVOTHROID) 200 MCG tablet Take 200 mcg by mouth daily before  breakfast.    [provider]  metoprolol succinate (TOPROL-XL) 50 MG 24 hr tablet Take 1 tablet (50 mg total) by mouth at bedtime. 10/03/16   Rai, Vernelle Emerald, MD  Multiple Vitamins-Minerals (CENTRUM SILVER PO) Take 1 tablet by mouth daily.    [provider]  nystatin (MYCOSTATIN/NYSTOP) powder Apply topically 3 (three) times daily. 10/03/16   Rai, Vernelle Emerald, MD  omega-3 acid ethyl esters (LOVAZA) 1 G capsule Take 1 g by mouth 2 (two) times daily.  07/07/15   [provider]  OVER THE COUNTER MEDICATION Take 2 capsules by mouth 2 (two) times daily. Sesame Seed Oil    [provider]  ranitidine (ZANTAC) 75 MG tablet Take 75 mg by mouth 2  (two) times daily.    [provider]  Red Yeast Rice 600 MG CAPS Take 600 mg by mouth 2 (two) times daily.    [provider]    Family History History reviewed. No pertinent family history.  Social History Social History   Tobacco Use  . Smoking status: Never Smoker  . Smokeless tobacco: Never Used  Substance Use Topics  . Alcohol use: No  . Drug use: No     Allergies   Phenergan [promethazine hcl]; Tetanus toxoid; Levaquin [levofloxacin in d5w]; and Levofloxacin   Review of Systems Review of Systems  Unable to perform ROS: Dementia  Constitutional: Negative for activity change.  Respiratory: Negative for shortness of breath.   Cardiovascular: Negative for chest pain.  Gastrointestinal: Negative for abdominal pain.     Physical Exam Updated Vital Signs BP (!) 157/75 (BP Location: Right Arm)   Pulse 85   Temp 97.8 F (36.6 C) (Oral)   Resp 20   Wt 84.4 kg (186 lb 1.1 oz)   SpO2 97%   BMI 28.29 kg/m   Physical Exam  Constitutional: She appears well-developed and well-nourished.  HENT:  Head: Normocephalic and atraumatic.  Eyes: Right eye exhibits no discharge. Left eye exhibits no discharge.  Cardiovascular: Normal rate, regular rhythm and normal heart sounds.  No murmur heard. Pulmonary/Chest: Effort normal. She has wheezes. She has no rales.  Light expiratory wheeze  Abdominal: Soft. She exhibits no distension. There is no tenderness.  Neurological:  Oriented to self.  With smile, facial nerve appears intact.\ Moving upper arms normally, global weakness consistent with age and condition.  Skin: Skin is warm and dry. She is not diaphoretic.  Psychiatric: She has a normal mood and affect.  Nursing note and vitals reviewed.    ED Treatments / Results  Labs (all labs ordered are listed, but only abnormal results are displayed) Labs Reviewed  PROTIME-INR - Abnormal; Notable for the following components:      Result Value    Prothrombin Time 17.7 (*)    All other components within normal limits  CBG MONITORING, ED - Abnormal; Notable for the following components:   Glucose-Capillary 121 (*)    All other components within normal limits  I-STAT CHEM 8, ED - Abnormal; Notable for the following components:   Chloride 99 (*)    Glucose, Bld 132 (*)    All other components within normal limits  APTT  CBC  DIFFERENTIAL  COMPREHENSIVE METABOLIC PANEL  I-STAT TROPONIN, ED    EKG  EKG Interpretation None       Radiology Ct Head Code Stroke Wo Contrast  Result Date: 02/21/2018 CLINICAL DATA:  Code stroke. LEFT facial droop with slurred speech. Generalized weakness. Altered mental status.  EXAM: CT HEAD WITHOUT CONTRAST TECHNIQUE: Contiguous axial images were obtained from the base of the skull through the vertex without intravenous contrast. COMPARISON:  CT head 09/30/2016. FINDINGS: Brain: No evidence for acute infarction, hemorrhage, mass lesion, hydrocephalus, or extra-axial fluid. Advanced atrophy. Chronic microvascular ischemic change. Vascular: Calcification of the cavernous internal carotid arteries and distal vertebral arteries consistent with cerebrovascular atherosclerotic disease. No signs of intracranial large vessel occlusion. Skull: Normal. Negative for fracture or focal lesion. Sinuses/Orbits: No acute finding. Other: Compared with priors, the RIGHT frontal parenchymal hemorrhage has resolved. ASPECTS [facility] Stroke Program Early CT Score) - Ganglionic level infarction (caudate, lentiform nuclei, internal capsule, insula, M1-M3 cortex): 7 - Supraganglionic infarction (M4-M6 cortex): 3 Total score (0-10 with 10 being normal): 10 IMPRESSION: 1. Atrophy and small vessel disease. No acute intracranial findings. 2. ASPECTS is 10. 3. These results were communicated to Dr. Cheral Marker at 11:03 amon 3/16/2019by text page via the [facility] messaging system. Electronically Signed   By: Staci Righter M.D.   On: 02/21/2018  11:05    Procedures Procedures (including critical care time)  Medications Ordered in ED Medications - No data to display   Initial Impression / Assessment and Plan / ED Course  I have reviewed the triage vital signs and the nursing notes.  Pertinent labs & imaging results that were available during my care of the patient were reviewed by me and considered in my medical decision making (see chart for details).    Patient is a 84 year old female presenting as a code stroke.  According to family patient was mildly altered  last night when she went to bed at 10 PM.  Woke up this morning much more altered.  Patient's best baseline is alert to self and self transferring from wheelchair to bed.  According family this morning she was unable to transfer by herself.  On arrival here patient is oriented toward self.  No focal neuro deficits.  Patient initially called code stroke.  However after normal CT, code stroke canceled.  Patient has facial droop on left. Mild dyssarthria.  These are both thought to be relatively mild, and nonfocal by neurology.  Normal vital signs.  Physical exam reassuring.   11:32 AM Will wait for family to arrive to get a better idea about patient's baseline.  Will get labs, urine chest x-ray to make sure patient does not have occult infection causing her to be weaker than usual.  1:40 PM Family maintains that her slurred speech and facial droop are different than usual.  I will admit for stroke and continue workup.    Final Clinical Impressions(s) / ED Diagnoses   Final diagnoses:  None    ED Discharge Orders    None       Briany Aye, Fredia Sorrow, MD 02/22/18 1124

## 2018-02-21 NOTE — ED Notes (Signed)
Pt still at MRI at this time, will start pt abx when she returns.

## 2018-02-21 NOTE — H&P (Addendum)
[facility] Admission History and Physical Service Pager: [phone]  Patient name: Martha Walters Medical record number: [id number] Date of birth: [birth year] Age: 84 y.o. Gender: female  Primary Care Provider: Egbert Garibaldi, PA-C Consultants: neurology (signed off) Code Status: FULL (awaiting HCPOA arrival this afternoon, daughter)  Chief Complaint: AMS  Assessment and Plan: Martha Walters is a 84 y.o. female presenting with AMS. 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   AMS: last seen normal last night before bed, this AM with new global weakness (unable to stand) and L facial droop, new dysarthia + trouble swallowing. Code stroke called in ED and cleared per Dr. Earnestine Leys as CT head without acute change. Stroke remains at top of differential given acute onset of neurological changes combined with hx of CLL and subtherapeutic INR. Will pursue MRI brain. Trop neg x 1, patient denies CP. LBBB noted on EKG, daughter specifically mentions hx of same. Murmur noted on exam, daughter confirms hx of this. UA clear. CT head with atrophy and small vessel disease. ASPECTS 10 (indicating normal). CXR with focal opacity in R infrahilar region c/w PNA vs underlying malignancy. Less likely sepsis or UTI 2/2 normal UA and VSS. PNA seems unlikely cause of facial droop, dysarthia. Hypoglycemia less likely as patient did not take Lantus today, CBG WNL, and persistent L facial droop. Neuro had recommended toxic metabolic workup including TSH, B12, ammonia, LFTs, ESR, RPR.  -admit to tele, Dr. Andria Frames attending -MRI brain -SLP eval and treat (failed bedside swallow 2/2 new dysarthria) -PT/OT -echo -lipid panel  -a1c as below -consider carotid US pending MRI results -TSH -B12 -CMP    Community Acquired Pneumonia: Daughter reports history of recurrent pneumonias.  Concern for possible aspiration as a source.  Speech eval as above.  Chest  x-ray with findings suggestive for right infrahilar airspace disease.  Malignancy is also a concern at this time.  Will trial antibiotics for community-acquired pneumonia and recommend repeat of chest x-ray in 3-4 weeks to assess for possible underlying pneumonia. -Ceftriaxone and azithromycin  CLL: Leukocytosis to 77 - atypical lympocytes + smudge cells on diff. Hx of known CLL, follows with . Concern for hypercoagulable state and subtherapeutic INR as a possible cause of potential CVA as above.  -monitor on daily CBC  -consider heme onc vs calling her [facility] hematologist   Thrombocytopenia: appears chronic per Care Everywhere chart review.  -monitor on CBC   Hx of recurrent DVTs: daughter report on warfarin for this. Dosed at home by pill box through PCP, daughter denies missing doses. Subtherapeutic INR.  -hold pending further CVA workup above  Mild hyponatremia: new. Question decreased PO given new swallowing trouble and dementia.   -NS 100cc/hr x 12 hours  T2DM: on lantus 28U q am at home per daugther. Did not get this today.  -sSSI  -repeat a1c  R lateral 5th toe wound: appears chronic, question poor perfusion. Does not appear infected.  -wound care consult  Hypothyroidism: hx of synthroid as home med.  -recheck TSH as above -continue synthroid  FEN/GI: NPO pending swallow eval Prophylaxis: SCDs  Disposition: admit to tele  History of Present Illness:  Martha Walters is a 84 y.o. female presenting with altered mental status.  She lives with her husband and receives daily care from home health aides.  Daughter was with her this morning and noticed new left-sided facial droop, new slurred speech, and spelling of coffee from her mouth when she  tried to drink.  Notes she was last seen normal last night.  Acknowledges history of dementia, and states that she is at her normal mental status.  Daughter also states that it seemed like her mom had trouble forming words this morning  additionally, could not stand up, which is new.  The patient is limited to a wheelchair, however is normally able to stand.  She has not had any food today.  Also, has diabetes and is on Lantus 2 units every morning as well as glipizide at home.  Daughter8 reports that she did not receive any insulin today.  No recent medication changes including any over-the-counter meds.  She does have a history of lots of coughing.  No history of known CVA.  History of capital CLL for 20 years, counts are measured [facility] at [facility].  Daughter reports she is on Coumadin chronically for history of bilateral DVTs.  At baseline, she is able to feed herself and requires assistance for all other ADLs.  She told her daughter today that she feels that her tongue feels thick.  Daughter reports she does have a history of pneumonia several times, most recently 2 months ago.  No recent hospitalizations nor recent admissions to SNF.  In the ED, code stroke was called.  CT head without any abnormality and code stroke canceled per Dr. Cheral Marker.    Review Of Systems: Per HPI with the following additions: Denies fever, endorses new speech change, new weakness denies abdominal pain or dysuria.,   ROS  Patient Active Problem List   Diagnosis Date Noted  . Altered mental status 02/21/2018  . Closed fracture of zygomaticomaxillary complex ([place]) 10/01/2016  . Facial hematoma 10/01/2016  . Confusion 10/01/2016  . SDH (subdural hematoma) ([place]) 09/30/2016  . DM2 (diabetes mellitus, type 2) ([place]) 09/30/2016  . HTN (hypertension) 09/30/2016  . Chronic lymphocytic leukemia ([place]) 12/26/2015  . Acquired trigger finger 06/21/2014    Past Medical History: Past Medical History:  Diagnosis Date  . Bruises easily   . Clotting disorder ([place])   . Diabetes mellitus without complication ([place])   . Hypertension   . Thyroid disease     Past Surgical History: Past Surgical History:  Procedure Laterality Date  . trigger finger injection       Social History: Social History   Tobacco Use  . Smoking status: Never Smoker  . Smokeless tobacco: Never Used  Substance Use Topics  . Alcohol use: No  . Drug use: No   Additional social history: Lives with husband and receives daily home health aide Please also refer to relevant sections of EMR.  Family History: History reviewed. No pertinent family history. Unable to obtain as patient is demented.  Allergies and Medications: Allergies  Allergen Reactions  . Phenergan [Promethazine Hcl] Other (See Comments)    Over Sedation  . Tetanus Toxoid Swelling  . Levaquin [Levofloxacin In D5w] Hives  . Levofloxacin Hives   No current facility-administered medications on file prior to encounter.    Current Outpatient Medications on File Prior to Encounter  Medication Sig Dispense Refill  . allopurinol (ZYLOPRIM) 100 MG tablet Take 100 mg by mouth every morning.     Marland Kitchen amoxicillin (AMOXIL) 500 MG capsule Take 1 capsule (500 mg total) by mouth every 12 (twelve) hours. X 10days    . citalopram (CELEXA) 10 MG tablet Take 10 mg by mouth every evening.     . famotidine (PEPCID) 10 MG tablet Take 1 tablet (10 mg total) by mouth  2 (two) times daily.    . insulin aspart (NOVOLOG) 100 UNIT/ML injection Inject 0-9 Units into the skin 3 (three) times daily with meals. Sliding scale CBG 70 - 120: 0 units CBG 121 - 150: 1 unit,  CBG 151 - 200: 2 units,  CBG 201 - 250: 3 units,  CBG 251 - 300: 5 units,  CBG 301 - 350: 7 units,  CBG 351 - 400: 9 units   CBG > 400: 9 units and notify your MD 10 mL 11  . insulin aspart (NOVOLOG) 100 UNIT/ML injection Inject 5 Units into the skin 3 (three) times daily with meals. 10 mL 11  . insulin glargine (LANTUS) 100 UNIT/ML injection Inject 0.18 mLs (18 Units total) into the skin at bedtime. 10 mL 11  . LEUKERAN 2 MG tablet Take 2 mg by mouth See admin instructions. Take 2 mg by mouth on Monday, Wednesday, Friday and Sunday.    . levothyroxine (SYNTHROID,  LEVOTHROID) 200 MCG tablet Take 200 mcg by mouth daily before breakfast.    . metoprolol succinate (TOPROL-XL) 50 MG 24 hr tablet Take 1 tablet (50 mg total) by mouth at bedtime.    . Multiple Vitamins-Minerals (CENTRUM SILVER PO) Take 1 tablet by mouth daily.    Marland Kitchen nystatin (MYCOSTATIN/NYSTOP) powder Apply topically 3 (three) times daily. 15 g 0  . omega-3 acid ethyl esters (LOVAZA) 1 G capsule Take 1 g by mouth 2 (two) times daily.     Marland Kitchen OVER THE COUNTER MEDICATION Take 2 capsules by mouth 2 (two) times daily. Sesame Seed Oil    . ranitidine (ZANTAC) 75 MG tablet Take 75 mg by mouth 2 (two) times daily.    . Red Yeast Rice 600 MG CAPS Take 600 mg by mouth 2 (two) times daily.      Objective: BP (!) 128/48   Pulse 81   Temp 97.8 F (36.6 C) (Oral)   Resp (!) 23   Wt 186 lb 1.1 oz (84.4 kg)   SpO2 94%   BMI 28.29 kg/m  Exam: General: Frail elderly female lying in bed in no acute distress. Eyes: PEERLA, EOMI ENTM: MMM, dentures in place Neck: supple, no JVD Cardiovascular: 3/6 systolic murmur, regular rate Respiratory: mild end expiratory wheeze in bilateral mid lung fields, no focal abnormalities, good air movement  Gastrointestinal: SNTND, +BS  MSK: age appropriate muscle tone and bulk Derm: chronic wound over R lateral 5th digit, no drainage Neuro: CN II-XII grossly intact, +L facial droop, 5/5 strength in all extremities Psych: pleasantly demented  Labs and Imaging: CBC BMET  Recent Labs  Lab 02/21/18 1105 02/21/18 1112  WBC 77.5*  --   HGB 11.9* 12.2  HCT 36.5 36.0  PLT 56*  --    Recent Labs  Lab 02/21/18 1105 02/21/18 1112  NA 134* 136  K 4.7 4.0  CL 101 99*  CO2 25  --   BUN 12 12  CREATININE 0.79 0.70  GLUCOSE 137* 132*  CALCIUM 9.0  --      Dg Chest 2 View  Result Date: 02/21/2018 CLINICAL DATA:  Chest pain EXAM: CHEST - 2 VIEW COMPARISON:  10/31/2017 FINDINGS: Lungs are under aerated. There is focal opacity in the right infrahilar region. No  pneumothorax. No pleural effusion. IMPRESSION: Findings are worrisome for right infrahilar airspace disease. Followup PA and lateral chest X-ray is recommended in 3-4 weeks following trial of antibiotic therapy to ensure resolution and exclude underlying malignancy. Electronically Signed   By:  Marybelle Killings M.D.   On: 02/21/2018 13:09   Ct Head Code Stroke Wo Contrast  Result Date: 02/21/2018 CLINICAL DATA:  Code stroke. LEFT facial droop with slurred speech. Generalized weakness. Altered mental status. EXAM: CT HEAD WITHOUT CONTRAST TECHNIQUE: Contiguous axial images were obtained from the base of the skull through the vertex without intravenous contrast. COMPARISON:  CT head 09/30/2016. FINDINGS: Brain: No evidence for acute infarction, hemorrhage, mass lesion, hydrocephalus, or extra-axial fluid. Advanced atrophy. Chronic microvascular ischemic change. Vascular: Calcification of the cavernous internal carotid arteries and distal vertebral arteries consistent with cerebrovascular atherosclerotic disease. No signs of intracranial large vessel occlusion. Skull: Normal. Negative for fracture or focal lesion. Sinuses/Orbits: No acute finding. Other: Compared with priors, the RIGHT frontal parenchymal hemorrhage has resolved. ASPECTS [facility] Stroke Program Early CT Score) - Ganglionic level infarction (caudate, lentiform nuclei, internal capsule, insula, M1-M3 cortex): 7 - Supraganglionic infarction (M4-M6 cortex): 3 Total score (0-10 with 10 being normal): 10 IMPRESSION: 1. Atrophy and small vessel disease. No acute intracranial findings. 2. ASPECTS is 10. 3. These results were communicated to Dr. Cheral Marker at 11:03 amon 3/16/2019by text page via the [facility] messaging system. Electronically Signed   By: Staci Righter M.D.   On: 02/21/2018 11:05    Sela Hilding, MD 02/21/2018, 2:09 PM PGY-2, [place] [facility] pager: [phone], text pages welcome

## 2018-02-21 NOTE — Consult Note (Addendum)
NEURO HOSPITALIST CONSULT NOTE   Requestig physician: Dr. Thomasene Lot  Reason for Consult: AMS  History obtained from:  EMS and Chart     HPI:                                                                                                                                          Martha Walters is an 84 y.o. female who was in her USOH until sometime yesterday, when she was "a little bit off" per EMS personnel discussion with family. She was essentially LKN except for some mild increased confusion when she went to bed last night at 2200. This AM family noted increased confusion and inability to stand for transfer from bed to wheelchair. EMS was then called. On arrival, EMS noted left facial droop, leaning to the left and slurred speech with confusion. CBG was 142. At baseline she cannot walk but can transfer from bed to wheelchair.   EKG obtained by EMS shows sinus arrhythmia with 1st degree AV block and LBBB.  She is on warfarin for bilateral DVTs. PMHx also includes HTN, DM and thyroid disease.   Past Medical History:  Diagnosis Date  . Bruises easily   . Clotting disorder ([place])   . Diabetes mellitus without complication ([place])   . Hypertension   . Thyroid disease     Past Surgical History:  Procedure Laterality Date  . trigger finger injection      No family history on file.           Social History:  reports that  has never smoked. she has never used smokeless tobacco. She reports that she does not drink alcohol or use drugs.  Allergies  Allergen Reactions  . Phenergan [Promethazine Hcl] Other (See Comments)    Over Sedation  . Tetanus Toxoid Swelling  . Levaquin [Levofloxacin In D5w] Hives  . Levofloxacin Hives    HOME MEDICATIONS:                                                                                                                     Insulin Ranitidine Allopurinol  Glipizide Warfarin Omega 3  ROS:  Unable to obtain detailed ROS due to AMS. Not endorsing any pain complaints.  There were no vitals taken for this visit.   General Examination:                                                                                                      HEENT-  /AT   Lungs- Respirations unlabored Extremities- Warm and well perfused  Neurological Examination Mental Status: Awake and alert. Oriented to self, "2018" and "Friday". Not oriented to city. Speech fluent with intact comprehension and repetition. Able to follow a 2-step directional command.  Cranial Nerves: II: Tracks normally. PERRL. No visual field cut. III,IV, VI: No ptosis not present. EOM full with saccadic pursuits noted.  V,VII: Smile symmetric. Facial temp sensation subjectively intact bilaterally.  VIII: Hearing intact to voice IX,X: Palate rises symmetrically XI: Symmetric  XII: midline tongue extension Motor: Right : Upper extremity   5/5    Left:     Upper extremity   5/5  Lower extremity   5/5     Lower extremity   5/5 Patient does not relax legs to command for accurate testing of tone. Upper ext tone normal. Bulk normal x 4.  Sensory: Intact upper extremity temp sensation bilaterally. FT intact x 4 without extinction.  Deep Tendon Reflexes:  Upper extremities hypoactive.  Unable to elicit lower extremity reflexes as patient will not relax to command.  Plantars: Right: upgoing   Left: equivocal Cerebellar: No ataxia with FNF bilaterally Gait: Deferred   Lab Results: Basic Metabolic Panel: No results for input(s): NA, K, CL, CO2, GLUCOSE, BUN, CREATININE, CALCIUM, MG, PHOS in the last 168 hours.  CBC: No results for input(s): WBC, NEUTROABS, HGB, HCT, MCV, PLT in the last 168 hours.  Cardiac Enzymes: No results for input(s): CKTOTAL, CKMB, CKMBINDEX, TROPONINI in the last 168 hours.  Lipid Panel: No results for input(s):  CHOL, TRIG, HDL, CHOLHDL, VLDL, LDLCALC in the last 168 hours.  Imaging: No results found.  Assessment: 84 year old female presenting with AMS 1. No lateralizing findings seen on exam. Confusion without agitation on exam, most likely secondary to AMS on an underlying dementia.  2. CT head with diffuse cerebral atrophy and chronic microvascular ischemic changes. No acute findings noted.   Recommendations: 1. Toxic/metabolic/infectious work up.  2. TSH, B12, ammonia, LFTs, ESR, RPR  Addendum: MRI brain reveals a wedge shaped right paramedian pontine ischemic infarction without associated large vessel occlusion. Advanced atrophy with chronic microvascular ischemic changes are also noted. Further stroke work up is indicated, to include MRA head, carotid ultrasound, TTE and cardiac telemetry. Currently on warfarin for bilateral lower extremity DVT. If she is eventually taken off anticoagulation (for resolved DVT), she will need to be started on an antiplatelet medication for secondary stroke prevention. Stroke team to follow in the AM.   Electronically signed: Dr. Kerney Elbe 02/21/2018, 10:53 AM

## 2018-02-21 NOTE — ED Triage Notes (Signed)
PT From  home . Family called EMS for Stroke SX's. Family reported to EMS Pt was not normal at 2200 Friday night. This morning at 0900 Family reported upon waking PT had LT eye closed unable to open,Lt sided weakness ,Lt sided facial droop, slurred speech and to weak  To transfer from bed to chair. Pt base line non-ambulatory and lives with family.

## 2018-02-22 ENCOUNTER — Inpatient Hospital Stay (HOSPITAL_COMMUNITY): Payer: Medicare Other

## 2018-02-22 ENCOUNTER — Other Ambulatory Visit: Payer: Self-pay

## 2018-02-22 ENCOUNTER — Encounter (HOSPITAL_COMMUNITY): Payer: Medicare Other

## 2018-02-22 DIAGNOSIS — J189 Pneumonia, unspecified organism: Secondary | ICD-10-CM

## 2018-02-22 DIAGNOSIS — I6330 Cerebral infarction due to thrombosis of unspecified cerebral artery: Secondary | ICD-10-CM

## 2018-02-22 DIAGNOSIS — R1310 Dysphagia, unspecified: Secondary | ICD-10-CM

## 2018-02-22 DIAGNOSIS — F0390 Unspecified dementia without behavioral disturbance: Secondary | ICD-10-CM

## 2018-02-22 LAB — BASIC METABOLIC PANEL
Anion gap: 6 (ref 5–15)
BUN: 12 mg/dL (ref 6–20)
CALCIUM: 8.2 mg/dL — AB (ref 8.9–10.3)
CO2: 24 mmol/L (ref 22–32)
CREATININE: 0.83 mg/dL (ref 0.44–1.00)
Chloride: 106 mmol/L (ref 101–111)
GFR calc Af Amer: >60 mL/min (ref >60)
GFR calc non Af Amer: >60 mL/min (ref >60)
Glucose, Bld: 152 mg/dL — ABNORMAL HIGH (ref 65–99)
Potassium: 4.3 mmol/L (ref 3.5–5.1)
SODIUM: 136 mmol/L (ref 135–145)

## 2018-02-22 LAB — CBC
HEMATOCRIT: 33.8 % — AB (ref 36.0–46.0)
Hemoglobin: 10.9 g/dL — ABNORMAL LOW (ref 12.0–15.0)
MCH: 30.4 pg (ref 26.0–34.0)
MCHC: 32.2 g/dL (ref 30.0–36.0)
MCV: 94.4 fL (ref 78.0–100.0)
PLATELETS: 54 10*3/uL — AB (ref 150–400)
RBC: 3.58 MIL/uL — ABNORMAL LOW (ref 3.87–5.11)
RDW: 16.6 % — AB (ref 11.5–15.5)
WBC: 47.2 10*3/uL — AB (ref 4.0–10.5)

## 2018-02-22 LAB — LIPID PANEL
Cholesterol: 173 mg/dL (ref 0–200)
HDL: 27 mg/dL — ABNORMAL LOW (ref >40)
LDL CALC: 115 mg/dL — AB (ref 0–99)
Total CHOL/HDL Ratio: 6.4 RATIO
Triglycerides: 153 mg/dL — ABNORMAL HIGH (ref <150)
VLDL: 31 mg/dL (ref 0–40)

## 2018-02-22 LAB — GLUCOSE, CAPILLARY
GLUCOSE-CAPILLARY: 140 mg/dL — AB (ref 65–99)
Glucose-Capillary: 165 mg/dL — ABNORMAL HIGH (ref 65–99)

## 2018-02-22 LAB — TSH: TSH: 1.591 u[IU]/mL (ref 0.350–4.500)

## 2018-02-22 LAB — HEMOGLOBIN A1C
HEMOGLOBIN A1C: 8.8 % — AB (ref 4.8–5.6)
Mean Plasma Glucose: 205.86 mg/dL

## 2018-02-22 LAB — VITAMIN B12: VITAMIN B 12: 972 pg/mL — AB (ref 180–914)

## 2018-02-22 MED ORDER — RESOURCE THICKENUP CLEAR PO POWD
ORAL | Status: DC | PRN
  Filled 2018-02-22: qty 125

## 2018-02-22 MED ORDER — SODIUM CHLORIDE 0.45 % IV SOLN
INTRAVENOUS | Status: DC
  Administered 2018-02-22: 15:00:00 via INTRAVENOUS
  Administered 2018-02-23: 75 mL via INTRAVENOUS
  Administered 2018-02-23: 02:00:00 via INTRAVENOUS
  Administered 2018-02-24: 500 mL via INTRAVENOUS
  Administered 2018-02-24: 1000 mL via INTRAVENOUS
  Administered 2018-02-24: 11:00:00 via INTRAVENOUS

## 2018-02-22 MED ORDER — ATORVASTATIN CALCIUM 40 MG PO TABS
40.0000 mg | ORAL_TABLET | Freq: Every day | ORAL | Status: DC
  Administered 2018-02-23: 40 mg via ORAL
  Filled 2018-02-22: qty 1

## 2018-02-22 MED ORDER — PANTOPRAZOLE SODIUM 40 MG IV SOLR
40.0000 mg | INTRAVENOUS | Status: DC
  Administered 2018-02-22 – 2018-02-23 (×2): 40 mg via INTRAVENOUS
  Filled 2018-02-22 (×2): qty 40

## 2018-02-22 NOTE — Progress Notes (Signed)
Rn called because patient with 6 sec pause on telemetry.  Upon review of rhythm patient with multiple 6-9 sec pauses, otherwise 1 degree heart block.  Patient also had difficulty managing her secretions when lying flat.  Encourage RN to keep head of bed elevated at least 30 degrees.  Also recommended calling md for evaluation of pauses.

## 2018-02-22 NOTE — Evaluation (Addendum)
Occupational Therapy Evaluation Patient Details Name: Martha Walters MRN: [id number] DOB: 1929-07-15 Today's Date: 02/22/2018    History of Present Illness Pt is an 84 yo female admitted through the ED on 02/21/18 as a code stroke with slurred speech, difficulty transferring, L lateral lean and facial droop. 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 PMH significant for HTN, DM2, Thyroid disease.    Clinical Impression   PTA, pt was living with her husband and required assistance for ADLs and transfer from husband and PCA. Pt requiring Max A +2 for bed mobility and performed grooming at EOB with Min-Mod A for sitting balance and Max cues to sequence task. Pt requiring suctioning at EOB and HR going from 105 to 47 and then 102. Return to supine and elevated HOB. RN reporting asystole during session. Pt would benefit from further acute OT to facilitate safe dc. Recommend dc to SNF for further OT to optimize safety and participation with BADLs and functional transfers as well as decrease caregiver burden.      Follow Up Recommendations  SNF;Supervision/Assistance - 24 hour    Equipment Recommendations  None recommended by OT    Recommendations for Other Services PT consult;Speech consult     Precautions / Restrictions Precautions Precautions: Fall Restrictions Weight Bearing Restrictions: No      Mobility Bed Mobility Overal bed mobility: Needs Assistance Bed Mobility: Supine to Sit;Sit to Supine     Supine to sit: Max assist;+2 for physical assistance Sit to supine: Max assist;+2 for physical assistance   General bed mobility comments: Pt requries Max A x2 for safety to move to EOB and maintain sitting at EOB. Pt requires cues to initiate movements and is able to follow only 1 step commands.   Transfers                 General transfer comment: Not attempted due to response in sitting.  Pt becomes nauseated    Balance Overall balance assessment: Needs assistance Sitting-balance support: Bilateral upper extremity supported;Feet supported Sitting balance-Leahy Scale: Zero Sitting balance - Comments: pt requries assistance from clinicians to maintain upright sitting EOB and leans posteriorly and to the left Postural control: Left lateral lean;Posterior lean                                 ADL either performed or assessed with clinical judgement   ADL Overall ADL's : Needs assistance/impaired Eating/Feeding: NPO   Grooming: Wash/dry face;Moderate assistance;Standing;Cueing for sequencing;Cueing for safety                                 General ADL Comments: Pt requiring Max-Total A for bathing, dressing, and toileting. Sitting at EOB with Min-Mod A and washing her face with max cues. Pt getting nauseous during session. HR jumping from 106 to 55 while at EOB. reporting to supine in bed with [facility] elevated     Vision         Perception     Praxis      Pertinent Vitals/Pain Pain Assessment: No/denies pain     Hand Dominance Right   Extremity/Trunk Assessment Upper Extremity Assessment Upper Extremity Assessment: Generalized weakness   Lower Extremity Assessment Lower Extremity Assessment: Generalized weakness   Cervical / Trunk Assessment Cervical / Trunk  Assessment: Kyphotic   Communication Communication Communication: Expressive difficulties   Cognition Arousal/Alertness: Lethargic Behavior During Therapy: WFL for tasks assessed/performed Overall Cognitive Status: History of cognitive impairments - at baseline Area of Impairment: Attention;Following commands;Problem solving                   Current Attention Level: Selective   Following Commands: Follows one step commands consistently;Follows multi-step commands inconsistently     Problem Solving: Slow processing;Decreased initiation;Requires verbal  cues;Requires tactile cues General Comments: Baseline dementia. Family reports she was more talkative PTA. Pt having significant difficulties swallowing and has to be sunctioned multiple times throughout   General Comments  Son and daugther are present during assessment and provide background information    Exercises     Shoulder Instructions      Home Living Family/patient expects to be discharged to:: Private residence Living Arrangements: Spouse/significant other;Other relatives Available Help at Discharge: Family;Available 24 hours/day Type of Home: House Home Access: Level entry     Home Layout: One level     Bathroom Shower/Tub: Occupational psychologist: Standard Bathroom Accessibility: No   Home Equipment: Wheelchair - manual;Bedside commode;Shower seat;Hospital bed          Prior Functioning/Environment Level of Independence: Needs assistance  Gait / Transfers Assistance Needed: WC bound and had assistance to do all ADLS ADL's / Homemaking Assistance Needed: requires assistance with all ADLs and IADLs Communication / Swallowing Assistance Needed: awaiting swallow screen and communication is impaired           OT Problem List: Decreased strength;Decreased range of motion;Decreased activity tolerance;Impaired balance (sitting and/or standing);Impaired vision/perception;Decreased cognition;Decreased safety awareness;Decreased knowledge of use of DME or AE      OT Treatment/Interventions: Self-care/ADL training;Therapeutic exercise;Energy conservation;DME and/or AE instruction;Therapeutic activities;Patient/family education    OT Goals(Current goals can be found in the care plan section) Acute Rehab OT Goals Patient Stated Goal: family would like patient to got to rehab facility  OT Goal Formulation: With family Time For Goal Achievement: 03/08/18 Potential to Achieve Goals: Good ADL Goals Pt Will Perform Grooming: with min guard assist;sitting Pt Will  Perform Upper Body Dressing: sitting;with min assist(Mod cues) Additional ADL Goal #1: Pt will perform bed mobiltiy with Mod A +2  OT Frequency: Min 2X/week   Barriers to D/C:            Co-evaluation PT/OT/SLP Co-Evaluation/Treatment: Yes Reason for Co-Treatment: Complexity of the patient's impairments (multi-system involvement) PT goals addressed during session: Mobility/safety with mobility;Balance OT goals addressed during session: ADL's and self-care      AM-PAC PT "6 Clicks" Daily Activity     Outcome Measure Help from another person eating meals?: Total Help from another person taking care of personal grooming?: A Lot Help from another person toileting, which includes using toliet, bedpan, or urinal?: A Lot Help from another person bathing (including washing, rinsing, drying)?: A Lot Help from another person to put on and taking off regular upper body clothing?: A Lot Help from another person to put on and taking off regular lower body clothing?: Total 6 Click Score: 10   End of Session Nurse Communication: Mobility status  Activity Tolerance: Patient tolerated treatment well Patient left: in bed;with call bell/phone within reach;with bed alarm set;with family/visitor present  OT Visit Diagnosis: Unsteadiness on feet (R26.81);Other abnormalities of gait and mobility (R26.89);Muscle weakness (generalized) (M62.81);Other symptoms and signs involving cognitive function  Time: 3818-4037 OT Time Calculation (min): 26 min Charges:  OT General Charges $OT Visit: 1 Visit OT Evaluation $OT Eval Moderate Complexity: 1 Mod G-Codes:     Alaa Eyerman MSOT, OTR/L Acute Rehab Pager: [phone] Office: [place] 02/22/2018, 1:32 PM

## 2018-02-22 NOTE — Evaluation (Signed)
Speech Language Pathology Evaluation Patient Details Name: Martha Walters MRN: [id number] DOB: 08/20/1929 Today's Date: 02/22/2018 Time: 1062-6948 SLP Time Calculation (min) (ACUTE ONLY): 14 min  Problem List:  Patient Active Problem List   Diagnosis Date Noted  . Altered mental status 02/21/2018  . Closed fracture of zygomaticomaxillary complex ([place]) 10/01/2016  . Facial hematoma 10/01/2016  . Confusion 10/01/2016  . SDH (subdural hematoma) ([place]) 09/30/2016  . DM2 (diabetes mellitus, type 2) ([place]) 09/30/2016  . HTN (hypertension) 09/30/2016  . Chronic lymphocytic leukemia ([place]) 12/26/2015  . Acquired trigger finger 06/21/2014   Past Medical History:  Past Medical History:  Diagnosis Date  . Bruises easily   . Clotting disorder ([place])   . Diabetes mellitus without complication ([place])   . Hypertension   . Thyroid disease    Past Surgical History:  Past Surgical History:  Procedure Laterality Date  . trigger finger injection     HPI:  Martha Walters a 84 y.o.femalepresenting with AMS. 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 Per MD pt has had PNA recently.   Assessment / Plan / Recommendation Clinical Impression  Patient presents with moderate dysarthria impacting intelligibility in single word responses (children's names) and severe cognitive communication impairment, suspect worsened from baseline. Vocal intensity is significantly reduced, and oral weakness impacts articulation. She states current president as Martha Walters; daughter reports this is atypical. She follows single step commands fairly consistently (90%), accuracy declines with complexity due to decreased sustained attention and suspected working memory impairment. Facial weakness/droop appears more pronounced today compared with yesterday's assessment. Prognosis is  fair given pt's baseline function and overall medical prognosis due to suspected severe dysphagia. Will follow acutely in order to maximize pt's functional communication, ability to make needs/wants known, and pt/caregiver education.     SLP Assessment  SLP Recommendation/Assessment: Patient needs continued Speech Lanaguage Pathology Services SLP Visit Diagnosis: Dysarthria and anarthria (R47.1);Cognitive communication deficit (R41.841)    Follow Up Recommendations  Other (comment)(tbd)    Frequency and Duration min 1 x/week  1 week      SLP Evaluation Cognition  Overall Cognitive Status: Impaired/Different from baseline Arousal/Alertness: Awake/alert Orientation Level: Oriented to person;Oriented to situation Attention: Sustained Sustained Attention: Impaired Sustained Attention Impairment: Functional basic;Verbal basic Memory: Impaired Memory Impairment: Decreased recall of new information;Decreased long term memory;Decreased short term memory Decreased Long Term Memory: Functional basic Decreased Short Term Memory: Functional basic;Verbal basic       Comprehension  Auditory Comprehension Overall Auditory Comprehension: Impaired Yes/No Questions: Within Functional Limits Commands: Impaired One Step Basic Commands: 75-100% accurate Two Step Basic Commands: 50-74% accurate(60%) Multistep Basic Commands: 0-24% accurate Conversation: Simple Interfering Components: Attention;Processing speed;Working Field seismologist: Public house manager: Not tested Reading Comprehension Reading Status: Not tested    Expression Expression Primary Mode of Expression: Verbal Verbal Expression Overall Verbal Expression: Impaired Automatic Speech: Name;Social Response Level of Generative/Spontaneous Verbalization: Phrase Repetition: Impaired Level of Impairment: Sentence level Naming:  Impairment Confrontation: Impaired(2/5 black and white drawings) Pragmatics: No impairment Interfering Components: Attention Non-Verbal Means of Communication: Not applicable Written Expression Dominant Hand: Right Written Expression: Not tested   Oral / Motor  Oral Motor/Sensory Function Overall Oral Motor/Sensory Function: Moderate impairment Facial ROM: Reduced left;Suspected CN VII (facial) dysfunction Facial Symmetry: Abnormal symmetry left;Suspected CN VII (facial) dysfunction Facial Strength: Reduced left;Suspected CN VII (facial) dysfunction Lingual ROM: Within Functional  Limits Lingual Symmetry: Within Functional Limits Mandible: Within Functional Limits Motor Speech Overall Motor Speech: Impaired Respiration: Impaired Level of Impairment: Word Phonation: Low vocal intensity;Wet Resonance: Within functional limits Articulation: Impaired Level of Impairment: Word Intelligibility: Intelligibility reduced Word: 75-100% accurate Phrase: 75-100% accurate Sentence: 50-74% accurate Conversation: Not tested Motor Planning: Witnin functional limits Motor Speech Errors: Consistent Interfering Components: Inadequate dentition Effective Techniques: Increased vocal intensity   GO                   Deneise Lever, [place], CCC-SLP Speech-Language Pathologist [place] 02/22/2018, 10:02 AM

## 2018-02-22 NOTE — Progress Notes (Signed)
  Speech Language Pathology Treatment: Dysphagia  Patient Details Name: Martha Walters MRN: [id number] DOB: [birth year] Today's Date: 02/22/2018 Time: 0347-4259 SLP Time Calculation (min) (ACUTE ONLY): 15 min  Assessment / Plan / Recommendation Clinical Impression  Pt seen for follow-up for dysphagia. She had episodes of emesis overnight. No vomiting or nausea this morning. Son is present at bedside, and daughter Martha Walters on speakerphone. SLP provided oral care and teaching to pt's son re: use of suction toothette vs water-soaked sponges to clean and moisten pt's mouth, which is very dry due to mouthbreathing, open mouth resting posture. With ice chips, pt manipulates slowly, with apparent passive vs active transfer of bolus into pharynx. With max cues and extended time, pt does initiate what appears to be a weak pharyngeal swallow. Distractibility, weakness and impaired sensation lead to suspected aspiration events, with wet subtle throat clearing and wet vocal quality. Daughter and son eager to pursue MBS to aid in decision making re: patient's care. When asked directly, pt shakes her head no when asked if she would ever want to have a feeding tube. SLP did provide education re: treatment options, and agreed MBS would be helpful in determining pt's prognosis for oral feeding. Will proceed with instrumental testing today.     HPI HPI: Martha Walters a 84 y.o.femalepresenting with AMS. 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 Per MD pt has had PNA recently.      SLP Plan  MBS       Recommendations  Diet recommendations: NPO Medication Administration: Via alternative means                Oral Care Recommendations: Oral care QID Follow up Recommendations: Other (comment)(tbd) SLP Visit Diagnosis: Dysphagia, oropharyngeal phase  (R13.12) Plan: MBS       GO               Martha Walters, [place], CCC-SLP Speech-Language Pathologist [phone]  Martha Walters 02/22/2018, 9:12 AM

## 2018-02-22 NOTE — Progress Notes (Signed)
  Echocardiogram 2D Echocardiogram has been performed.  Martha Walters 02/22/2018, 4:53 PM

## 2018-02-22 NOTE — Progress Notes (Signed)
Paged Lindell Noe, MD Pt had a 7.67 pause and HR was 110. Had just laid Pt HOB flat to give ASA 300mg  supp. And the Pt started to gag again. MD aware and recommended to not lay the Pt flat if possible.

## 2018-02-22 NOTE — Consult Note (Signed)
Reason for Consult:Recurrent pauses up to 9 seconds/sinus arrest Referring Physician [facility] hospitalist  Martha Walters is an 84 y.o. female.  HPI: patient is 84 year old female with past medical history significant for hypertension, diabetes mellitus, CLL, dementia, thrombocytopenia, hypothyroidism, history of bilateral DVT,was admitted yesterday because of confusion left-sided facial droop and slurred speech and was noted to have acute right CVA involving parapons  region and mid brain.Cardiologic consultation is called as patient was noted to have multiple episodes of sinus arrest and pauses up to 9 ms. Patient mostly bedbound at home. Patient also was noted to have community acquired pneumonia and significant dysphagia.patient denies any chest pain shortness of breath or dizziness. Family has decided not to proceed with consideration for pacemaker unless how she recovers from this stroke and pneumonia. Patient not on AV blocking medications.  Past Medical History:  Diagnosis Date  . Bruises easily   . Clotting disorder ([place])   . Diabetes mellitus without complication ([place])   . Hypertension   . Thyroid disease     Past Surgical History:  Procedure Laterality Date  . trigger finger injection      History reviewed. No pertinent family history.  Social History:  reports that  has never smoked. she has never used smokeless tobacco. She reports that she does not drink alcohol or use drugs.  Allergies:  Allergies  Allergen Reactions  . Phenergan [Promethazine Hcl] Other (See Comments)    Over Sedation  . Tetanus Toxoid Swelling  . Avelox [Moxifloxacin] Rash  . Levaquin [Levofloxacin In D5w] Hives  . Levofloxacin Hives  . Xarelto [Rivaroxaban]     Medications: I have reviewed the patient's current medications.  Results for orders placed or performed during the hospital encounter of 02/21/18 (from the past 48 hour(s))  Protime-INR     Status: Abnormal   Collection Time: 02/21/18 11:05  AM  Result Value Ref Range   Prothrombin Time 17.7 (H) 11.4 - 15.2 seconds   INR 1.47     Comment: Performed at [facility] [address], [place], [place] [address]  APTT     Status: None   Collection Time: 02/21/18 11:05 AM  Result Value Ref Range   aPTT 27 24 - 36 seconds    Comment: Performed at [facility] [address] [place], [place] [address]  CBC     Status: Abnormal   Collection Time: 02/21/18 11:05 AM  Result Value Ref Range   WBC 77.5 (HH) 4.0 - 10.5 K/uL    Comment: REPEATED TO VERIFY CRITICAL RESULT CALLED TO, READ BACK BY AND VERIFIED WITH: RN J HOLCOMB AT 1133 [id number] MARTINB    RBC 3.95 3.87 - 5.11 MIL/uL   Hemoglobin 11.9 (L) 12.0 - 15.0 g/dL   HCT 36.5 36.0 - 46.0 %   MCV 92.4 78.0 - 100.0 fL   MCH 30.1 26.0 - 34.0 pg   MCHC 32.6 30.0 - 36.0 g/dL   RDW 16.2 (H) 11.5 - 15.5 %   Platelets 56 (L) 150 - 400 K/uL    Comment: REPEATED TO VERIFY SPECIMEN CHECKED FOR CLOTS PLATELET COUNT CONFIRMED BY SMEAR Performed at [facility], [place] [address] [place], [place] [address]   Differential     Status: Abnormal   Collection Time: 02/21/18 11:05 AM  Result Value Ref Range   Neutrophils Relative % 3 %   Neutro Abs 2.3 1.7 - 7.7 K/uL   Lymphocytes Relative 95 %   Lymphs Abs 73.6 (  H) 0.7 - 4.0 K/uL   Monocytes Relative 2 %   Monocytes Absolute 1.1 (H) 0.1 - 1.0 K/uL   WBC Morphology ATYPICAL LYMPHOCYTES     Comment: SMUDGE CELLS Performed at [facility] [address] [place], [place] [address]   Comprehensive metabolic panel     Status: Abnormal   Collection Time: 02/21/18 11:05 AM  Result Value Ref Range   Sodium 134 (L) 135 - 145 mmol/L   Potassium 4.7 3.5 - 5.1 mmol/L   Chloride 101 101 - 111 mmol/L   CO2 25 22 - 32 mmol/L   Glucose, Bld 137 (H) 65 - 99 mg/dL   BUN 12 6 - 20 mg/dL   Creatinine, Ser 0.79 0.44 - 1.00 mg/dL   Calcium 9.0 8.9 - 10.3 mg/dL   Total Protein 5.7 (L) 6.5 - 8.1 g/dL   Albumin 3.5 3.5 - 5.0  g/dL   AST 38 15 - 41 U/L   ALT 26 14 - 54 U/L   Alkaline Phosphatase 91 38 - 126 U/L   Total Bilirubin 0.8 0.3 - 1.2 mg/dL   GFR calc non Af Amer >60 >60 mL/min   GFR calc Af Amer >60 >60 mL/min    Comment: (NOTE) The eGFR has been calculated using the CKD EPI equation. This calculation has not been validated in all clinical situations. eGFR's persistently <60 mL/min signify possible Chronic Kidney Disease.    Anion gap 8 5 - 15    Comment: Performed at [facility] [address] [place],  [address]  CBG monitoring, ED     Status: Abnormal   Collection Time: 02/21/18 11:06 AM  Result Value Ref Range   Glucose-Capillary 121 (H) 65 - 99 mg/dL  I-stat troponin, ED     Status: None   Collection Time: 02/21/18 11:10 AM  Result Value Ref Range   Troponin i, poc 0.00 0.00 - 0.08 ng/mL   Comment 3            Comment: Due to the release kinetics of cTnI, a negative result within the first hours of the onset of symptoms does not rule out myocardial infarction with certainty. If myocardial infarction is still suspected, repeat the test at appropriate intervals.   I-Stat Chem 8, ED     Status: Abnormal   Collection Time: 02/21/18 11:12 AM  Result Value Ref Range   Sodium 136 135 - 145 mmol/L   Potassium 4.0 3.5 - 5.1 mmol/L   Chloride 99 (L) 101 - 111 mmol/L   BUN 12 6 - 20 mg/dL   Creatinine, Ser 0.70 0.44 - 1.00 mg/dL   Glucose, Bld 132 (H) 65 - 99 mg/dL   Calcium, Ion 1.17 1.15 - 1.40 mmol/L   TCO2 26 22 - 32 mmol/L   Hemoglobin 12.2 12.0 - 15.0 g/dL   HCT 36.0 36.0 - 46.0 %  Urinalysis, Routine w reflex microscopic     Status: None   Collection Time: 02/21/18  1:10 PM  Result Value Ref Range   Color, Urine YELLOW YELLOW   APPearance CLEAR CLEAR   Specific Gravity, Urine 1.009 1.005 - 1.030   pH 7.0 5.0 - 8.0   Glucose, UA NEGATIVE NEGATIVE mg/dL   Hgb urine dipstick NEGATIVE NEGATIVE   Bilirubin Urine NEGATIVE NEGATIVE   Ketones, ur NEGATIVE NEGATIVE  mg/dL   Protein, ur NEGATIVE NEGATIVE mg/dL   Nitrite NEGATIVE NEGATIVE   Leukocytes, UA NEGATIVE NEGATIVE    Comment: Performed  at [facility], [place] [address] [place], [place] [address]  CBG monitoring, ED     Status: Abnormal   Collection Time: 02/21/18  4:28 PM  Result Value Ref Range   Glucose-Capillary 118 (H) 65 - 99 mg/dL  Glucose, capillary     Status: Abnormal   Collection Time: 02/22/18  6:15 AM  Result Value Ref Range   Glucose-Capillary 140 (H) 65 - 99 mg/dL   Comment 1 Notify RN    Comment 2 Document in Chart   CBC     Status: Abnormal   Collection Time: 02/22/18  7:12 AM  Result Value Ref Range   WBC 47.2 (H) 4.0 - 10.5 K/uL    Comment: CONSISTENT WITH PREVIOUS RESULT   RBC 3.58 (L) 3.87 - 5.11 MIL/uL   Hemoglobin 10.9 (L) 12.0 - 15.0 g/dL   HCT 33.8 (L) 36.0 - 46.0 %   MCV 94.4 78.0 - 100.0 fL   MCH 30.4 26.0 - 34.0 pg   MCHC 32.2 30.0 - 36.0 g/dL   RDW 16.6 (H) 11.5 - 15.5 %   Platelets 54 (L) 150 - 400 K/uL    Comment: CONSISTENT WITH PREVIOUS RESULT Performed at [facility], [place] [address] [place], [place] [address]   TSH     Status: None   Collection Time: 02/22/18  7:12 AM  Result Value Ref Range   TSH 1.591 0.350 - 4.500 uIU/mL    Comment: Performed by a 3rd Generation assay with a functional sensitivity of <=0.01 uIU/mL. Performed at [facility], [place] [address] [place], [place] [address]   Vitamin B12     Status: Abnormal   Collection Time: 02/22/18  7:12 AM  Result Value Ref Range   Vitamin B-12 972 (H) 180 - 914 pg/mL    Comment: (NOTE) This assay is not validated for testing neonatal or myeloproliferative syndrome specimens for Vitamin B12 levels. Performed at [facility], [place] [address], [place], [place] [address]   Basic metabolic panel     Status: Abnormal   Collection Time: 02/22/18  7:12 AM  Result Value Ref Range   Sodium 136 135 - 145 mmol/L   Potassium 4.3 3.5 - 5.1 mmol/L   Chloride 106 101 - 111  mmol/L   CO2 24 22 - 32 mmol/L   Glucose, Bld 152 (H) 65 - 99 mg/dL   BUN 12 6 - 20 mg/dL   Creatinine, Ser 0.83 0.44 - 1.00 mg/dL   Calcium 8.2 (L) 8.9 - 10.3 mg/dL   GFR calc non Af Amer >60 >60 mL/min   GFR calc Af Amer >60 >60 mL/min    Comment: (NOTE) The eGFR has been calculated using the CKD EPI equation. This calculation has not been validated in all clinical situations. eGFR's persistently <60 mL/min signify possible Chronic Kidney Disease.    Anion gap 6 5 - 15    Comment: Performed at [facility] [address] [place], [place] [address]  Hemoglobin A1c     Status: Abnormal   Collection Time: 02/22/18  7:12 AM  Result Value Ref Range   Hgb A1c MFr Bld 8.8 (H) 4.8 - 5.6 %    Comment: (NOTE) Pre diabetes:          5.7%-6.4% Diabetes:              >6.4% Glycemic control for   <7.0% adults with diabetes    Mean Plasma Glucose 205.86 mg/dL  Comment: Performed at [facility], [place] [address] [place], [place] [address]  Lipid panel     Status: Abnormal   Collection Time: 02/22/18  7:12 AM  Result Value Ref Range   Cholesterol 173 0 - 200 mg/dL   Triglycerides 153 (H) <150 mg/dL   HDL 27 (L) >40 mg/dL   Total CHOL/HDL Ratio 6.4 RATIO   VLDL 31 0 - 40 mg/dL   LDL Cholesterol 115 (H) 0 - 99 mg/dL    Comment:        Total Cholesterol/HDL:CHD Risk Coronary Heart Disease Risk Table                     Men   Women  1/2 Average Risk   3.4   3.3  Average Risk       5.0   4.4  2 X Average Risk   9.6   7.1  3 X Average Risk  23.4   11.0        Use the calculated Patient Ratio above and the CHD Risk Table to determine the patient's CHD Risk.        ATP III CLASSIFICATION (LDL):  <100     mg/dL   Optimal  100-129  mg/dL   Near or Above                    Optimal  130-159  mg/dL   Borderline  160-189  mg/dL   High  >190     mg/dL   Very High Performed at [facility] [address], [place], [place] [address]     Dg Chest 2 View  Result  Date: 02/21/2018 CLINICAL DATA:  Chest pain EXAM: CHEST - 2 VIEW COMPARISON:  10/31/2017 FINDINGS: Lungs are under aerated. There is focal opacity in the right infrahilar region. No pneumothorax. No pleural effusion. IMPRESSION: Findings are worrisome for right infrahilar airspace disease. Followup PA and lateral chest X-ray is recommended in 3-4 weeks following trial of antibiotic therapy to ensure resolution and exclude underlying malignancy. Electronically Signed   By: Marybelle Killings M.D.   On: 02/21/2018 13:09   Mr Brain Wo Contrast  Result Date: 02/21/2018 CLINICAL DATA:  Code stroke, mildly altered, unable to transfer. EXAM: MRI HEAD WITHOUT CONTRAST TECHNIQUE: Multiplanar, multiecho pulse sequences of the brain and surrounding structures were obtained without intravenous contrast. COMPARISON:  Code stroke CT earlier today. FINDINGS: Brain: There is a small tubular area of restricted diffusion affecting the RIGHT paramedian pons consistent with acute infarction. No other areas of involvement are seen. There is no hemorrhage, mass lesion, or extra-axial fluid. There is extensive atrophy with prominence of the ventricles, cisterns, and sulci. Hypoattenuation of white matter representing small vessel disease is observed. Vascular: Flow voids are maintained throughout the carotid, basilar, and vertebral arteries. There is a small focus of chronic hemorrhage in the RIGHT frontal lobe, either sequelae of old trauma or ischemia. Skull and upper cervical spine: Normal marrow signal. Sinuses/Orbits: Negative. Other: Correlating with the earlier CT, the infarct is not visible. IMPRESSION: Small area of acute brainstem infarction affecting the RIGHT paramedian pons, without associated large vessel occlusion. This infarction is consistent with a small vessel ischemic insult secondary to hypertension and/or diabetes. Advanced atrophy with chronic microvascular ischemic change elsewhere. No visible acute hemorrhage.  Electronically Signed   By: Staci Righter M.D.   On: 02/21/2018 16:20   Ct Head Code Stroke Wo Contrast  Result Date: 02/21/2018 CLINICAL DATA:  Code stroke. LEFT facial droop with slurred speech. Generalized weakness. Altered mental status. EXAM: CT HEAD WITHOUT CONTRAST TECHNIQUE: Contiguous axial images were obtained from the base of the skull through the vertex without intravenous contrast. COMPARISON:  CT head 09/30/2016. FINDINGS: Brain: No evidence for acute infarction, hemorrhage, mass lesion, hydrocephalus, or extra-axial fluid. Advanced atrophy. Chronic microvascular ischemic change. Vascular: Calcification of the cavernous internal carotid arteries and distal vertebral arteries consistent with cerebrovascular atherosclerotic disease. No signs of intracranial large vessel occlusion. Skull: Normal. Negative for fracture or focal lesion. Sinuses/Orbits: No acute finding. Other: Compared with priors, the RIGHT frontal parenchymal hemorrhage has resolved. ASPECTS [facility] Stroke Program Early CT Score) - Ganglionic level infarction (caudate, lentiform nuclei, internal capsule, insula, M1-M3 cortex): 7 - Supraganglionic infarction (M4-M6 cortex): 3 Total score (0-10 with 10 being normal): 10 IMPRESSION: 1. Atrophy and small vessel disease. No acute intracranial findings. 2. ASPECTS is 10. 3. These results were communicated to Dr. Cheral Marker at 11:03 amon 3/16/2019by text page via the [facility] messaging system. Electronically Signed   By: Staci Righter M.D.   On: 02/21/2018 11:05    Review of Systems  Constitutional: Negative for chills and fever.  Respiratory: Negative for cough and shortness of breath.   Cardiovascular: Negative for chest pain and palpitations.  Gastrointestinal: Negative for abdominal pain, nausea and vomiting.  Genitourinary: Positive for dysuria.  Neurological: Positive for weakness. Negative for headaches.   Blood pressure (!) 145/53, pulse 100, temperature 99.4 F (37.4 C),  temperature source Oral, resp. rate 18, height _0  (1.6 m), weight 83.8 kg (184 lb 11.9 oz), SpO2 96 %. Physical Exam  HENT:  Head: Normocephalic and atraumatic.  Eyes: Conjunctivae are normal. Left eye exhibits no discharge. No scleral icterus.  Neck: Normal range of motion. Neck supple. No JVD present. No tracheal deviation present. No thyromegaly present.  Cardiovascular: Normal rate and regular rhythm.  Murmur (2/6 systolic and soft diastolic murmur noted) heard.   Assessment/Plan: Status postSymptomatic sinus bradycardia with pauses/sinus arrest Sick sinus syndrome Chronic left bundle branch block Acute right para pontine CVA Hypertension Diabetes mellitus CLL Early dementia Thrombocytopenia Hypothyroidism Dysphagia Possible right community-acquired pneumonia Plan Agree with present management Check 2-D echo Avoid AV blocking medications We will get EP consult if patient agrees, once neuro status is stabilized and pneumonia clears up. Charolette Forward 02/22/2018, 3:10 PM

## 2018-02-22 NOTE — Progress Notes (Signed)
Telemetry called RN to report episode of heart pause. RN notified MD, stat EKG ordered. Patient began to choke on sputum during EKG and RN came to room. Patient suctioned and sat upright. VS checked and stable. Rapid response called, MD notified.

## 2018-02-22 NOTE — Progress Notes (Signed)
Family Medicine Teaching Service Daily Progress Note Intern Pager: [phone]  Patient name: Martha Walters Medical record number: [id number] Date of birth: Oct 22, 1929 Age: 84 y.o. Gender: female  Primary Care Provider: Egbert Garibaldi, PA-C Consultants: neurology Code Status: DNR, confirmed with children as patient is demented  Pt Overview and Major Events to Date:  3/16 admitted with new pons CVA  Assessment and Plan: Martha Walters is a 84 y.o. female presenting with AMS. 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   Acute R paramedian pons CVA: presented with L facial droop, new dysarthia + trouble swallowing. MRI confirmed R paramedian pons infarct. Neurology following. Patient with vomiting last night when laid flat, curious whether brainstem stroke can cause this vomiting. Not posttussive emesis, although patient does have significant cough. Per discussion with son and daughter, they would like to decline MRA and carotid US as they would not elect for surgical intervention if there were significant findings on these tests.  -SLP eval and treat (failed bedside swallow 2/2 new dysarthria) -PT/OT -echo -lipid panel   Vomiting overnight: patient vomited overnight when laid flat. Son states this is not new for her and her reflux.  -IV PPI as NPO -SLP eval as above   Community Acquired Pneumonia: Daughter reports history of recurrent pneumonias.  Concern for possible aspiration as a source.  Speech eval as above.  Chest x-ray with findings suggestive for right infrahilar airspace disease.  Malignancy is also a concern at this time.  Will trial antibiotics for community-acquired pneumonia and recommend repeat of chest x-ray in 3-4 weeks to assess for possible underlying pneumonia. -Ceftriaxone and azithromycin -transition to PO abx 3/18  CLL: Leukocytosis to 77 - atypical lympocytes + smudge cells on diff. Hx of known CLL, follows with . Concern  for hypercoagulable state and subtherapeutic INR as a possible cause of potential CVA as above.  -monitor on daily CBC  -consider heme onc vs calling her [facility] hematologist   Thrombocytopenia: appears chronic per Care Everywhere chart review.  -monitor on CBC   Hx of recurrent DVTs: daughter report on warfarin for this. Dosed at home by pill box through PCP, daughter denies missing doses. Subtherapeutic INR.  -hold pending further CVA workup above  Mild hyponatremia: new. Question decreased PO given new swallowing trouble and dementia.   -NS 100cc/hr x 12 hours  T2DM: on lantus 28U q am at home per daugther. Did not get this today. A1c 8.8. NPO, will hold home lantus. -sSSI   R lateral 5th toe wound: appears chronic, question poor perfusion. Does not appear infected.  -wound care consult  Hypothyroidism: hx of synthroid as home med.  -recheck TSH as above -continue synthroid  FEN/GI: NPO pending swallow eval Prophylaxis: SCDs  Disposition: continued inpatient stay  Subjective:  Patient smiling. Long discussion with daughter and son about GOC. They would like to decline carotid US and MRA as they would not put their mom through surgery and are focusing on SLP eval results. Would like SNF.   Objective: Temp:  [97.8 F (36.6 C)-99 F (37.2 C)] 99 F (37.2 C) (03/17 0500) Pulse Rate:  [80-92] 89 (03/17 0500) Resp:  [19-24] 22 (03/17 0500) BP: (124-157)/(45-75) 130/45 (03/17 0500) SpO2:  [92 %-99 %] 95 % (03/17 0500) Weight:  [184 lb 11.9 oz (83.8 kg)-186 lb 1.1 oz (84.4 kg)] 184 lb 11.9 oz (83.8 kg) (03/16 2041) Physical Exam: General: frail elderly female in NAD Cardiovascular: RRR, 3/6 systolic murmur  Respiratory: CTAB, easy WOB Abdomen: SNTND, +BS Extremities: no edema Neuro: persistent L facial droop  Laboratory: Recent Labs  Lab 02/21/18 1105 02/21/18 1112  WBC 77.5*  --   HGB 11.9* 12.2  HCT 36.5 36.0  PLT 56*  --    Recent Labs  Lab 02/21/18 1105  02/21/18 1112  NA 134* 136  K 4.7 4.0  CL 101 99*  CO2 25  --   BUN 12 12  CREATININE 0.79 0.70  CALCIUM 9.0  --   PROT 5.7*  --   BILITOT 0.8  --   ALKPHOS 91  --   ALT 26  --   AST 38  --   GLUCOSE 137* 132*    Imaging/Diagnostic Tests: Dg Chest 2 View  Result Date: 02/21/2018 CLINICAL DATA:  Chest pain EXAM: CHEST - 2 VIEW COMPARISON:  10/31/2017 FINDINGS: Lungs are under aerated. There is focal opacity in the right infrahilar region. No pneumothorax. No pleural effusion. IMPRESSION: Findings are worrisome for right infrahilar airspace disease. Followup PA and lateral chest X-ray is recommended in 3-4 weeks following trial of antibiotic therapy to ensure resolution and exclude underlying malignancy. Electronically Signed   By: Marybelle Killings M.D.   On: 02/21/2018 13:09   Mr Brain Wo Contrast  Result Date: 02/21/2018 CLINICAL DATA:  Code stroke, mildly altered, unable to transfer. EXAM: MRI HEAD WITHOUT CONTRAST TECHNIQUE: Multiplanar, multiecho pulse sequences of the brain and surrounding structures were obtained without intravenous contrast. COMPARISON:  Code stroke CT earlier today. FINDINGS: Brain: There is a small tubular area of restricted diffusion affecting the RIGHT paramedian pons consistent with acute infarction. No other areas of involvement are seen. There is no hemorrhage, mass lesion, or extra-axial fluid. There is extensive atrophy with prominence of the ventricles, cisterns, and sulci. Hypoattenuation of white matter representing small vessel disease is observed. Vascular: Flow voids are maintained throughout the carotid, basilar, and vertebral arteries. There is a small focus of chronic hemorrhage in the RIGHT frontal lobe, either sequelae of old trauma or ischemia. Skull and upper cervical spine: Normal marrow signal. Sinuses/Orbits: Negative. Other: Correlating with the earlier CT, the infarct is not visible. IMPRESSION: Small area of acute brainstem infarction affecting  the RIGHT paramedian pons, without associated large vessel occlusion. This infarction is consistent with a small vessel ischemic insult secondary to hypertension and/or diabetes. Advanced atrophy with chronic microvascular ischemic change elsewhere. No visible acute hemorrhage. Electronically Signed   By: Staci Righter M.D.   On: 02/21/2018 16:20   Ct Head Code Stroke Wo Contrast  Result Date: 02/21/2018 CLINICAL DATA:  Code stroke. LEFT facial droop with slurred speech. Generalized weakness. Altered mental status. EXAM: CT HEAD WITHOUT CONTRAST TECHNIQUE: Contiguous axial images were obtained from the base of the skull through the vertex without intravenous contrast. COMPARISON:  CT head 09/30/2016. FINDINGS: Brain: No evidence for acute infarction, hemorrhage, mass lesion, hydrocephalus, or extra-axial fluid. Advanced atrophy. Chronic microvascular ischemic change. Vascular: Calcification of the cavernous internal carotid arteries and distal vertebral arteries consistent with cerebrovascular atherosclerotic disease. No signs of intracranial large vessel occlusion. Skull: Normal. Negative for fracture or focal lesion. Sinuses/Orbits: No acute finding. Other: Compared with priors, the RIGHT frontal parenchymal hemorrhage has resolved. ASPECTS [facility] Stroke Program Early CT Score) - Ganglionic level infarction (caudate, lentiform nuclei, internal capsule, insula, M1-M3 cortex): 7 - Supraganglionic infarction (M4-M6 cortex): 3 Total score (0-10 with 10 being normal): 10 IMPRESSION: 1. Atrophy and small vessel disease. No acute intracranial findings. 2.  ASPECTS is 10. 3. These results were communicated to Dr. Cheral Marker at 11:03 amon 3/16/2019by text page via the [facility] messaging system. Electronically Signed   By: Staci Righter M.D.   On: 02/21/2018 11:05     Sela Hilding, MD 02/22/2018, 7:00 AM PGY-2, [facility] Intern pager: [phone], text pages welcome

## 2018-02-22 NOTE — Progress Notes (Signed)
SLP Cancellation Note  Patient Details Name: Martha Walters MRN: [id number] DOB: November 07, 1929   Cancelled treatment:        SLP informed by radiology of cancellation due to rapid response called prior to pt transport to [facility]. Spoke with RN and pt/family; will defer MBS to next date.                                                                                                Aliene Altes 02/22/2018, 1:07 PM

## 2018-02-22 NOTE — Progress Notes (Addendum)
Modified Barium Swallow Progress Note  Patient Details  Name: Martha Walters MRN: [id number] Date of Birth: 1929/05/03  Today's Date: 02/22/2018  Modified Barium Swallow completed.  Full report located under Chart Review in the Imaging Section.  Brief recommendations include the following:  Clinical Impression  Patient presents with severe oropharyngeal dysphagia due to sensory, motor and cognitive impairments. Oral stage is characterized by prolonged oral holding, decreased oral manipulation leading to reduced bolus cohesion and delayed oral transit, premature spillage. Swallow initiation delayed, with prolonged pooling in the valleculae (puree) and pyriforms (honey, thin liquids). Pharyngeal stage characterized by mildly decreased base of tongue retraction, decreased hyolaryngeal excursion with delayed, incomplete closure of the laryngeal vestibule. There is penetration before the swallow with thin liquids, with eventual sensed aspiration, and penetration/aspiration during the swallow with honey-thick liquids due to spillage into the airway from liquid pooled in the pyriforms. Pt did sense aspiration, and SLP was able to remove some material via oral suction, though some barium did remain in the trachea. There was mild, shallow penetration with puree which was transient. Pt easily distracted during testing by anterior spillage and environmental commotion, requiring frequent verbal and tactile cues to refocus and initiate swallow. Son and daughter reviewed the study in real time and SLP explained findings, recommendations and rationale after completion of the exam. Recommend dys 1, pudding thick liquids by teaspoon, with full supervision and assistance, meds crushed. Feeding will be slow and effortful, and I anticipate pt will require significant cuing. Educated son and daughter in strategies including reducing environmental distractions, monitoring for swallow initiation, and cuing techniques. Will follow  for tolerance, interventions to improve swallow function, caregiver education, consider repeat instrumental assessment if improvements at bedside.   Swallow Evaluation Recommendations       SLP Diet Recommendations: Dysphagia 1 (Puree) solids;Pudding thick liquid   Liquid Administration via: Spoon   Medication Administration: Crushed with puree   Supervision: Full assist for feeding;Full supervision/cueing for compensatory strategies   Compensations: Slow rate;Small sips/bites;Minimize environmental distractions(cue with dry spoon if needed)   Postural Changes: Seated upright at 90 degrees   Oral Care Recommendations: Oral care BID;Oral care before and after PO   Other Recommendations: Order thickener from pharmacy;Prohibited food (jello, ice cream, thin soups);Remove water pitcher;Have oral suction available  Martha Lever, MS, CCC-SLP Speech-Language Pathologist [phone]  Martha Walters 02/22/2018,3:38 PM

## 2018-02-22 NOTE — Progress Notes (Signed)
STROKE TEAM PROGRESS NOTE   HISTORY OF PRESENT ILLNESS (per record)  Martha Walters is an 84 y.o. female who was in her USOH until sometime yesterday, when she was "a little bit off" per EMS personnel discussion with family. She was essentially LKN except for some mild increased confusion when she went to bed last night at 2200. This AM family noted increased confusion and inability to stand for transfer from bed to wheelchair. EMS was then called. On arrival, EMS noted left facial droop, leaning to the left and slurred speech with confusion. CBG was 142. At baseline she cannot walk but can transfer from bed to wheelchair.   EKG obtained by EMS shows sinus arrhythmia with 1st degree AV block and LBBB.  She is on warfarin for bilateral DVTs. PMHx also includes HTN, DM and thyroid disease.     SUBJECTIVE (INTERVAL HISTORY) Her son is at the bedside.  Son informs me that at baseline patient is mostly bedridden and has a very soft voice and quality of life is not good.  They are reluctant to have a feeding tube and unlikely to consider DNR and palliative care if she does not get better soon.    OBJECTIVE Temp:  [97.8 F (36.6 C)-99.4 F (37.4 C)] 99.4 F (37.4 C) (03/17 0818) Pulse Rate:  [80-95] 95 (03/17 0818) Cardiac Rhythm: Normal sinus rhythm;Heart block;Bundle branch block (03/17 0700) Resp:  [19-24] 22 (03/17 0818) BP: (124-157)/(45-75) 134/48 (03/17 0818) SpO2:  [92 %-99 %] 96 % (03/17 0818) Weight:  [184 lb 11.9 oz (83.8 kg)-186 lb 1.1 oz (84.4 kg)] 184 lb 11.9 oz (83.8 kg) (03/16 2041)  CBC:  Recent Labs  Lab 02/21/18 1105 02/21/18 1112 02/22/18 0712  WBC 77.5*  --  47.2*  NEUTROABS 2.3  --   --   HGB 11.9* 12.2 10.9*  HCT 36.5 36.0 33.8*  MCV 92.4  --  94.4  PLT 56*  --  54*    Basic Metabolic Panel:  Recent Labs  Lab 02/21/18 1105 02/21/18 1112 02/22/18 0712  NA 134* 136 136  K 4.7 4.0 4.3  CL 101 99* 106  CO2 25  --  24  GLUCOSE 137* 132* 152*  BUN 12 12  12   CREATININE 0.79 0.70 0.83  CALCIUM 9.0  --  8.2*    Lipid Panel:     Component Value Date/Time   CHOL 173 02/22/2018 0712   TRIG 153 (H) 02/22/2018 0712   HDL 27 (L) 02/22/2018 0712   CHOLHDL 6.4 02/22/2018 0712   VLDL 31 02/22/2018 0712   LDLCALC 115 (H) 02/22/2018 0712   HgbA1c:  Lab Results  Component Value Date   HGBA1C 8.8 (H) 02/22/2018   Urine Drug Screen: No results found for: LABOPIA, COCAINSCRNUR, LABBENZ, AMPHETMU, THCU, LABBARB  Alcohol Level No results found for: [facility]  IMAGING   Dg Chest 2 View 02/21/2018 IMPRESSION:  Findings are worrisome for right infrahilar airspace disease. Followup PA and lateral chest X-ray is recommended in 3-4 weeks following trial of antibiotic therapy to ensure resolution and exclude underlying malignancy.     Mr Brain Wo Contrast 02/21/2018 IMPRESSION:  Small area of acute brainstem infarction affecting the RIGHT paramedian pons, without associated large vessel occlusion. This infarction is consistent with a small vessel ischemic insult secondary to hypertension and/or diabetes. Advanced atrophy with chronic microvascular ischemic change elsewhere. No visible acute hemorrhage.    Ct Head Code Stroke Wo Contrast 02/21/2018 IMPRESSION:  1. Atrophy and small vessel disease.  No acute intracranial findings.  2. ASPECTS is 10.      Transthoracic Echocardiogram - pending 00/00/00      PHYSICAL EXAM Vitals:   02/21/18 2300 02/22/18 0105 02/22/18 0500 02/22/18 0818  BP: 125/60 (!) 125/53 (!) 130/45 (!) 134/48  Pulse: 83 92 89 95  Resp: (!) 22 (!) 24 (!) 22 (!) 22  Temp: 98.7 F (37.1 C) 98.6 F (37 C) 99 F (37.2 C) 99.4 F (37.4 C)  TempSrc: Oral Oral Oral Oral  SpO2: 94% 92% 95% 96%  Weight:      Height:       Frail elderly Caucasian lady lying comfortably in bed not in distress. . Afebrile. Head is nontraumatic. Neck is supple without bruit.    Cardiac exam no murmur or gallop. Lungs are clear to auscultation.  Distal pulses are well felt. Neurological Exam Awake alert oriented to person only.  Voice is very soft and at times difficult to understand.  Follows midline and two-step commands.  Extraocular movements are full range but slight saccadic dysmetria on left gaze.  Pupils irregular but equal reactive.  Fundi not visualized.  Face is symmetric without weakness.  Tongue midline.  Weak cough and gag.  Motor system exam able to move upper extremities quite well without focal weakness.  Symmetric strength in lower extremities but not able to left up against gravity.  No focal weakness.  Deep tendon reflexes are symmetric.  Sensation intact.  Plantars downgoing.  Gait not tested.          ASSESSMENT/PLAN Ms. Martha Walters is a 84 y.o. female with history of HTN, DM, thyroid disease, CLL and she is on warfarin for bilateral DVTs. She presents with presenting with left-sided weakness, confusion, and slurred speech. She did not receive IV t-PA due to late presentation.  Small area of acute brainstem infarction affecting the Rt paramedian pons 2/2 small vessel disease.  Resultant confusion   CT head - Atrophy and small vessel disease. No acute intracranial findings.   MRI head - Small area of acute brainstem infarction affecting the RIGHT paramedian pons  MRA head - not performed  Carotid Doppler - not ordered  2D Echo - pending  LDL - 115  HgbA1c - 8.8  VTE prophylaxis - SCDs Diet NPO time specified Fall precautions Aspiration precautions  warfarin daily prior to admission, now on aspirin 300 mg suppository daily  Patient counseled to be compliant with her antithrombotic medications  Ongoing aggressive stroke risk factor management  Therapy recommendations:  SNF recommended  Disposition:  Pending  Hypertension  Stable  Permissive hypertension (OK if < 220/120) but gradually normalize in 5-7 days  Long-term BP goal normotensive  Hyperlipidemia  Home meds: No lipid lowering  medications prior to admission  LDL 115, goal < 70  Consider Lipitor 20 to 40 mg daily  Continue statin at discharge  Diabetes  HgbA1c 8.8, goal < 7.0  Uncontrolled   Other Stroke Risk Factors  Advanced age  Obesity, Body mass index is 32.73 kg/m., recommend weight loss, diet and exercise as appropriate    Other Active Problems  Abnormal chest x-ray -follow-up recommended as noted above.  History of a clotting disorder  Bilateral DVTs -Coumadin PTA - INR 1.47 on day of admission (Saturday)  NPO  CAP - Zithromax and Rocephin  Anemia /thrombocytopenia /leukocytosis - CLL history    Plan / Recommendations   Stroke workup - consider ordering carotid Dopplers to complete stroke workup.   Hospital day #  1  I have personally examined this patient, reviewed notes, independently viewed imaging studies, participated in medical decision making and plan of care.ROS completed by me personally and pertinent positives fully documented  I have made any additions or clarifications directly to the above note.  She has underlying baseline dementia and poor quality of life.  She presented with confusion and agitation and MRI shows a small brainstem infarct.  I had a long discussion with the patient's son and he agrees in not being very aggressive.  He would not prefer a feeding tube or nursing home placement.  We will see how she does over the next few days and make decisions about disposition and level of care.  Continue ongoing stroke workup.  Greater than 50% time during this 35-minute visit was spent on counseling and coordination of care about her brainstem infarct, baseline dementia and answering questions Antony Contras, MD Medical Director [place] Pager: [phone] 02/22/2018 3:13 PM   To contact Stroke Continuity provider, please refer to [email or web address] After hours, contact General Neurology

## 2018-02-22 NOTE — Evaluation (Signed)
Physical Therapy Evaluation Patient Details Name: Martha Walters MRN: [id number] DOB: 1929-04-20 Today's Date: 02/22/2018   History of Present Illness  Pt is an 84 yo female admitted through the ED on 02/21/18 as a code stroke with slurred speech, difficulty transferring, L lateral lean and facial droop. 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 PMH significant for HTN, DM2, Thyroid disease.   Clinical Impression  Pt presents with the above diagnosis and below deficits for PT evaluation. Prior to admission, pt received assistance from husband and caregivers throughout the day and was WC bound and unable to mobilize herself in the chair. Pt required assistance to stand pivot transfer from bed <> chair and onto the [facility]. Pt requires MAX A x2 for all mobility this session and has a moment where she becomes unresponsive briefly. Per OT, pt requires sunctioning and per RN pt had a moment of asystole. Pt's HR went from 105 down to 47 and then back up to 102 throughout the remainder of the PT session. Pt was left supine in bed to await transport for a swallow study. Pt will benefit from continued acute PT in order to address the below deficits prior to discharge to venue recommended below.     Follow Up Recommendations SNF    Equipment Recommendations  None recommended by PT    Recommendations for Other Services       Precautions / Restrictions Precautions Precautions: Fall Restrictions Weight Bearing Restrictions: No      Mobility  Bed Mobility Overal bed mobility: Needs Assistance Bed Mobility: Supine to Sit;Sit to Supine     Supine to sit: Max assist;+2 for physical assistance Sit to supine: Max assist;+2 for physical assistance   General bed mobility comments: Pt requries Max A x2 for safety to move to EOB and maintain sitting at EOB. Pt requires cues to initiate movements and is able to follow only  1 step commands.   Transfers                 General transfer comment: Not attempted due to response in sitting. Pt becomes nauseated  Ambulation/Gait             General Gait Details: Non ambulatory prior to admission  Stairs            Wheelchair Mobility    Modified Rankin (Stroke Patients Only) Modified Rankin (Stroke Patients Only) Pre-Morbid Rankin Score: Severe disability Modified Rankin: Severe disability     Balance Overall balance assessment: Needs assistance Sitting-balance support: Bilateral upper extremity supported;Feet supported Sitting balance-Leahy Scale: Zero Sitting balance - Comments: pt requries assistance from clinicians to maintain upright sitting EOB and leans posteriorly and to the left Postural control: Left lateral lean;Posterior lean                                   Pertinent Vitals/Pain Pain Assessment: No/denies pain    Home Living Family/patient expects to be discharged to:: Private residence Living Arrangements: Spouse/significant other;Other relatives Available Help at Discharge: Family;Available 24 hours/day Type of Home: House Home Access: Level entry     Home Layout: One level Home Equipment: Wheelchair - manual;Bedside commode;Shower seat;Hospital bed      Prior Function Level of Independence: Needs assistance   Gait / Transfers Assistance Needed: WC boud and had assistance to do all ADLS  ADL's / Homemaking Assistance Needed: requires assistance with all ADLs and IADLs        Hand Dominance   Dominant Hand: Right    Extremity/Trunk Assessment   Upper Extremity Assessment Upper Extremity Assessment: Defer to OT evaluation    Lower Extremity Assessment Lower Extremity Assessment: Generalized weakness    Cervical / Trunk Assessment Cervical / Trunk Assessment: Kyphotic  Communication   Communication: Expressive difficulties  Cognition Arousal/Alertness: Lethargic Behavior During  Therapy: WFL for tasks assessed/performed Overall Cognitive Status: Impaired/Different from baseline Area of Impairment: Attention;Following commands;Problem solving                   Current Attention Level: Selective   Following Commands: Follows one step commands consistently;Follows multi-step commands inconsistently     Problem Solving: Slow processing;Decreased initiation;Requires verbal cues;Requires tactile cues General Comments: Pt having significant difficulties swallowing and has to be sunctioned multiple times throughout      General Comments General comments (skin integrity, edema, etc.): Son and daugther are present during assessment and provide background information    Exercises     Assessment/Plan    PT Assessment Patient needs continued PT services  PT Problem List Decreased strength;Decreased activity tolerance;Decreased balance;Decreased mobility;Decreased coordination;Decreased cognition;Decreased knowledge of use of DME       PT Treatment Interventions DME instruction;Stair training;Functional mobility training;Therapeutic activities;Therapeutic exercise;Balance training;Neuromuscular re-education;Patient/family education    PT Goals (Current goals can be found in the Care Plan section)  Acute Rehab PT Goals Patient Stated Goal: family would like patient to got to rehab facility  PT Goal Formulation: With patient/family Time For Goal Achievement: 03/01/18 Potential to Achieve Goals: Fair    Frequency Min 3X/week   Barriers to discharge        Co-evaluation PT/OT/SLP Co-Evaluation/Treatment: Yes Reason for Co-Treatment: Complexity of the patient's impairments (multi-system involvement);Necessary to address cognition/behavior during functional activity;For patient/therapist safety PT goals addressed during session: Mobility/safety with mobility;Balance         AM-PAC PT "6 Clicks" Daily Activity  Outcome Measure Difficulty turning over in  bed (including adjusting bedclothes, sheets and blankets)?: Unable Difficulty moving from lying on back to sitting on the side of the bed? : Unable Difficulty sitting down on and standing up from a chair with arms (e.g., wheelchair, bedside commode, etc,.)?: Unable Help needed moving to and from a bed to chair (including a wheelchair)?: Total Help needed walking in hospital room?: Total Help needed climbing 3-5 steps with a railing? : Total 6 Click Score: 6    End of Session Equipment Utilized During Treatment: Gait belt;Oxygen Activity Tolerance: Patient limited by fatigue;Patient limited by lethargy Patient left: in bed;with call bell/phone within reach;with family/visitor present;with bed alarm set Nurse Communication: Mobility status PT Visit Diagnosis: Muscle weakness (generalized) (M62.81);Unsteadiness on feet (R26.81);Other symptoms and signs involving the nervous system (I29.798)    Time: 9211-9417 PT Time Calculation (min) (ACUTE ONLY): 26 min   Charges:   PT Evaluation $PT Eval Moderate Complexity: 1 Mod     PT G Codes:        Scheryl Marten PT, DPT    Jahree Dermody Sloan Leiter 02/22/2018, 11:54 AM

## 2018-02-23 DIAGNOSIS — R4182 Altered mental status, unspecified: Secondary | ICD-10-CM

## 2018-02-23 LAB — CBC
HCT: 33 % — ABNORMAL LOW (ref 36.0–46.0)
HEMOGLOBIN: 10.5 g/dL — AB (ref 12.0–15.0)
MCH: 30.5 pg (ref 26.0–34.0)
MCHC: 31.8 g/dL (ref 30.0–36.0)
MCV: 95.9 fL (ref 78.0–100.0)
Platelets: 52 10*3/uL — ABNORMAL LOW (ref 150–400)
RBC: 3.44 MIL/uL — ABNORMAL LOW (ref 3.87–5.11)
RDW: 17.2 % — ABNORMAL HIGH (ref 11.5–15.5)
WBC: 43.5 10*3/uL — ABNORMAL HIGH (ref 4.0–10.5)

## 2018-02-23 LAB — GLUCOSE, CAPILLARY
GLUCOSE-CAPILLARY: 231 mg/dL — AB (ref 65–99)
GLUCOSE-CAPILLARY: 195 mg/dL — AB (ref 65–99)
Glucose-Capillary: 166 mg/dL — ABNORMAL HIGH (ref 65–99)
Glucose-Capillary: 208 mg/dL — ABNORMAL HIGH (ref 65–99)

## 2018-02-23 LAB — BASIC METABOLIC PANEL
Anion gap: 10 (ref 5–15)
BUN: 13 mg/dL (ref 6–20)
CHLORIDE: 106 mmol/L (ref 101–111)
CO2: 21 mmol/L — ABNORMAL LOW (ref 22–32)
Calcium: 7.8 mg/dL — ABNORMAL LOW (ref 8.9–10.3)
Creatinine, Ser: 0.93 mg/dL (ref 0.44–1.00)
GFR calc Af Amer: >60 mL/min (ref >60)
GFR calc non Af Amer: 54 mL/min — ABNORMAL LOW (ref >60)
GLUCOSE: 160 mg/dL — AB (ref 65–99)
POTASSIUM: 3.8 mmol/L (ref 3.5–5.1)
SODIUM: 137 mmol/L (ref 135–145)

## 2018-02-23 LAB — PROTIME-INR
INR: 1.81
PROTHROMBIN TIME: 20.8 s — AB (ref 11.4–15.2)

## 2018-02-23 MED ORDER — WARFARIN SODIUM 7.5 MG PO TABS
7.5000 mg | ORAL_TABLET | Freq: Once | ORAL | Status: AC
  Administered 2018-02-23: 7.5 mg via ORAL
  Filled 2018-02-23: qty 1

## 2018-02-23 MED ORDER — INSULIN ASPART 100 UNIT/ML ~~LOC~~ SOLN
0.0000 [IU] | Freq: Three times a day (TID) | SUBCUTANEOUS | Status: DC
Start: 2018-02-23 — End: 2018-02-27
  Administered 2018-02-23 – 2018-02-24 (×3): 2 [IU] via SUBCUTANEOUS
  Administered 2018-02-24: 3 [IU] via SUBCUTANEOUS
  Administered 2018-02-25 (×2): 2 [IU] via SUBCUTANEOUS
  Administered 2018-02-25: 5 [IU] via SUBCUTANEOUS
  Administered 2018-02-26: 7 [IU] via SUBCUTANEOUS
  Administered 2018-02-26: 3 [IU] via SUBCUTANEOUS
  Administered 2018-02-26: 5 [IU] via SUBCUTANEOUS
  Administered 2018-02-27: 3 [IU] via SUBCUTANEOUS
  Administered 2018-02-27: 5 [IU] via SUBCUTANEOUS

## 2018-02-23 MED ORDER — WARFARIN - PHARMACIST DOSING INPATIENT
Freq: Every day | Status: DC
  Administered 2018-02-23: 19:00:00

## 2018-02-23 MED ORDER — INSULIN ASPART 100 UNIT/ML ~~LOC~~ SOLN
0.0000 [IU] | Freq: Every day | SUBCUTANEOUS | Status: DC
  Administered 2018-02-23 – 2018-02-25 (×3): 2 [IU] via SUBCUTANEOUS
  Administered 2018-02-26: 3 [IU] via SUBCUTANEOUS

## 2018-02-23 MED ORDER — AMOXICILLIN-POT CLAVULANATE 875-125 MG PO TABS: 1.0000 | ORAL_TABLET | Freq: Two times a day (BID) | ORAL | Status: DC

## 2018-02-23 MED ORDER — AMOXICILLIN-POT CLAVULANATE 500-125 MG PO TABS
1.0000 | ORAL_TABLET | Freq: Two times a day (BID) | ORAL | Status: DC
  Administered 2018-02-23 – 2018-02-27 (×9): 500 mg via ORAL
  Filled 2018-02-23 (×10): qty 1

## 2018-02-23 MED ORDER — PANTOPRAZOLE SODIUM 40 MG PO TBEC
40.0000 mg | DELAYED_RELEASE_TABLET | Freq: Every day | ORAL | Status: DC
  Administered 2018-02-24: 40 mg via ORAL
  Filled 2018-02-23: qty 1

## 2018-02-23 MED ORDER — AZITHROMYCIN 500 MG PO TABS
500.0000 mg | ORAL_TABLET | Freq: Every day | ORAL | Status: AC
  Administered 2018-02-23 – 2018-02-25 (×3): 500 mg via ORAL
  Filled 2018-02-23 (×3): qty 1

## 2018-02-23 NOTE — Progress Notes (Signed)
Family Medicine Teaching Service Daily Progress Note Intern Pager: [phone]  Patient name: Martha Walters Medical record number: [id number] Date of birth: 1929-06-10 Age: 84 y.o. Gender: female  Primary Care Provider: Egbert Garibaldi, PA-C Consultants: neurology Code Status: DNR, confirmed with children as patient is demented  Pt Overview and Major Events to Date:  3/16 admitted with new pons CVA  Assessment and Plan: Martha Walters is a 84 y.o. female presenting with AMS. 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   Acute R paramedian pons CVA: presented with L facial droop, new dysarthia + trouble swallowing. MRI confirmed R paramedian pons infarct. Neurology following.   Per discussion with son and daughter, they would like to decline MRA and carotid US as they would not elect for surgical intervention if there were significant findings on these tests.  SLP recommends dys1, PT/OT recommend SNF.  Lipid panel mod elevated.  Family not electing for invasive interventions at this time  - admitted to tele, Dr. Ardelia Mems -echo pending -neuro checks -stroke precautions -aspirin 300 -permissive HTN to 220systolic, may normalize ~7/85 -aTORVASTATIN 20 -neuro to guide anticoagulation plan (warfarin currently stopped)  Vomiting overnight: patient vomited overnight when laid flat. Son states this is not new for her and her reflux.  -IV PPI as NPO -SLP eval as above   Community Acquired Pneumonia: Daughter reports history of recurrent pneumonias.  Concern for possible aspiration as a source.  Speech eval as above.  Chest x-ray with findings suggestive for right infrahilar airspace disease.  Malignancy is also a concern at this time.  Will trial antibiotics for community-acquired pneumonia and recommend repeat of chest x-ray in 3-4 weeks to assess for possible underlying pneumonia. -D/C Ceftriaxone and azithromycin -start PO abx 3/18  CLL:  Leukocytosis to 77 - atypical lympocytes + smudge cells on diff. Hx of known CLL, follows with . Concern for hypercoagulable state and subtherapeutic INR as a possible cause of potential CVA as above.  -monitor on daily CBC  -consider heme onc vs calling her [facility] hematologist   Thrombocytopenia: appears chronic per Care Everywhere chart review.  -monitor on CBC   Hx of recurrent DVTs: daughter report on warfarin for this. Dosed at home by pill box through PCP, daughter denies missing doses. Subtherapeutic INR.  -hold pending further CVA workup above  Hx of GI ulcer: -no indication of acute bleed currently, will monitor  Mild hyponatremia: new. Question decreased PO given new swallowing trouble and dementia.   -NS 100cc/hr x 12 hours  T2DM: on lantus 28U q am at home per daugther. Did not get this today. A1c 8.8. NPO, will hold home lantus. -sSSI   R lateral 5th toe wound: appears chronic, question poor perfusion. Does not appear infected.  -wound care consult  Hypothyroidism: hx of synthroid as home med.  TSH wnl -continue synthroid  Dementia- -home citalopram  Gout-  -Home allopurinol  FEN/GI: dys 1 Prophylaxis: SCDs  Disposition: to SNF when medically cleared by neuro  Subjective:  Patient was pain free and with son during morning rounds.  They were just starting a feed which had been going better with her sitting upright.  Objective: Temp:  [97.4 F (36.3 C)-99.4 F (37.4 C)] 98.3 F (36.8 C) (03/18 0418) Pulse Rate:  [94-104] 104 (03/18 0418) Resp:  [14-22] 16 (03/18 0418) BP: (127-164)/(41-55) 135/43 (03/18 0418) SpO2:  [95 %-98 %] 98 % (03/18 0418) Physical Exam: General: frail elderly female in NAD Cardiovascular: RRR,  3/6 systolic murmur Respiratory: CTAB, easy WOB Abdomen: SNTND, +BS Extremities: no edema Neuro: persistent L facial droop, mild confusion and slurring of words  Laboratory: Recent Labs  Lab 02/21/18 1105 02/21/18 1112  02/22/18 0712  WBC 77.5*  --  47.2*  HGB 11.9* 12.2 10.9*  HCT 36.5 36.0 33.8*  PLT 56*  --  54*   Recent Labs  Lab 02/21/18 1105 02/21/18 1112 02/22/18 0712  NA 134* 136 136  K 4.7 4.0 4.3  CL 101 99* 106  CO2 25  --  24  BUN 12 12 12   CREATININE 0.79 0.70 0.83  CALCIUM 9.0  --  8.2*  PROT 5.7*  --   --   BILITOT 0.8  --   --   ALKPHOS 91  --   --   ALT 26  --   --   AST 38  --   --   GLUCOSE 137* 132* 152*    Imaging/Diagnostic Tests: Dg Chest 2 View  Result Date: 02/21/2018 CLINICAL DATA:  Chest pain EXAM: CHEST - 2 VIEW COMPARISON:  10/31/2017 FINDINGS: Lungs are under aerated. There is focal opacity in the right infrahilar region. No pneumothorax. No pleural effusion. IMPRESSION: Findings are worrisome for right infrahilar airspace disease. Followup PA and lateral chest X-ray is recommended in 3-4 weeks following trial of antibiotic therapy to ensure resolution and exclude underlying malignancy. Electronically Signed   By: Marybelle Killings M.D.   On: 02/21/2018 13:09   Mr Brain Wo Contrast  Result Date: 02/21/2018 CLINICAL DATA:  Code stroke, mildly altered, unable to transfer. EXAM: MRI HEAD WITHOUT CONTRAST TECHNIQUE: Multiplanar, multiecho pulse sequences of the brain and surrounding structures were obtained without intravenous contrast. COMPARISON:  Code stroke CT earlier today. FINDINGS: Brain: There is a small tubular area of restricted diffusion affecting the RIGHT paramedian pons consistent with acute infarction. No other areas of involvement are seen. There is no hemorrhage, mass lesion, or extra-axial fluid. There is extensive atrophy with prominence of the ventricles, cisterns, and sulci. Hypoattenuation of white matter representing small vessel disease is observed. Vascular: Flow voids are maintained throughout the carotid, basilar, and vertebral arteries. There is a small focus of chronic hemorrhage in the RIGHT frontal lobe, either sequelae of old trauma or ischemia.  Skull and upper cervical spine: Normal marrow signal. Sinuses/Orbits: Negative. Other: Correlating with the earlier CT, the infarct is not visible. IMPRESSION: Small area of acute brainstem infarction affecting the RIGHT paramedian pons, without associated large vessel occlusion. This infarction is consistent with a small vessel ischemic insult secondary to hypertension and/or diabetes. Advanced atrophy with chronic microvascular ischemic change elsewhere. No visible acute hemorrhage. Electronically Signed   By: Staci Righter M.D.   On: 02/21/2018 16:20   Dg Swallowing Func-speech Pathology  Result Date: 02/22/2018 Objective Swallowing Evaluation: Type of Study: MBS-Modified Barium Swallow Study  Patient Details Name: Aradia Estey MRN: [id number] Date of Birth: Sep 06, 1929 Today's Date: 02/22/2018 Time: SLP Start Time (ACUTE ONLY): 6295 -SLP Stop Time (ACUTE ONLY): 1505 SLP Time Calculation (min) (ACUTE ONLY): 20 min Past Medical History: Past Medical History: Diagnosis Date . Bruises easily  . Clotting disorder ([place])  . Diabetes mellitus without complication ([place])  . Hypertension  . Thyroid disease  Past Surgical History: Past Surgical History: Procedure Laterality Date . trigger finger injection   HPI: Kerly Rigsbee a 84 y.o.femalepresenting with AMS. 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 Per MD pt has had PNA recently.  Subjective: Pt arrives to [place] with son and daughter Assessment / Plan / Recommendation CHL IP CLINICAL IMPRESSIONS 02/22/2018 Clinical Impression Patient presents with severe oropharyngeal dysphagia due to sensory, motor and cognitive impairments. Oral stage is characterized by prolonged oral holding, decreased oral manipulation leading to reduced bolus cohesion and delayed oral transit, premature spillage. Swallow initiation  delayed, with prolonged pooling in the valleculae (puree) and pyriforms (honey, thin liquids). Pharyngeal stage characterized by mildly decreased base of tongue retraction, decreased hyolaryngeal excursion with delayed, incomplete closure of the laryngeal vestibule. There is penetration before the swallow with thin liquids, with eventual sensed aspiration, and penetration/aspiration during the swallow with honey-thick liquids due to spillage into the airway from liquid pooled in the pyriforms. Pt did sense aspiration, and SLP was able to remove some material via oral suction, though some barium did remain in the trachea. There was mild, shallow penetration with puree which was transient. Pt easily distracted during testing by anterior spillage and environmental commotion, requiring frequent verbal and tactile cues to refocus and initiate swallow. Son and daughter reviewed the study in real time and SLP explained findings, recommendations and rationale after completion of the exam. Recommend dys 1, pudding thick liquids by teaspoon, with full supervision and assistance, meds crushed. Feeding will be slow and effortful, and I anticipate pt will require significant cuing. Educated son and daughter in strategies including reducing environmental distractions, monitoring for swallow initiation, and cuing techniques. Will follow for tolerance, interventions to improve swallow function, caregiver education, consider repeat instrumental assessment if improvements at bedside. SLP Visit Diagnosis Dysphagia, oral phase (R13.11);Dysphagia, oropharyngeal phase (R13.12);Dysphagia, pharyngeal phase (R13.13) Attention and concentration deficit following -- Frontal lobe and executive function deficit following -- Impact on safety and function Severe aspiration risk   CHL IP TREATMENT RECOMMENDATION 02/22/2018 Treatment Recommendations Therapy as outlined in treatment plan below   Prognosis 02/22/2018 Prognosis for Safe Diet Advancement  Fair Barriers to Reach Goals Cognitive deficits;Severity of deficits Barriers/Prognosis Comment -- CHL IP DIET RECOMMENDATION 02/22/2018 SLP Diet Recommendations Dysphagia 1 (Puree) solids;Pudding thick liquid Liquid Administration via Spoon Medication Administration Crushed with puree Compensations Slow rate;Small sips/bites;Minimize environmental distractions Postural Changes Seated upright at 90 degrees   CHL IP OTHER RECOMMENDATIONS 02/22/2018 Recommended Consults -- Oral Care Recommendations Oral care BID;Oral care before and after PO Other Recommendations Order thickener from pharmacy;Prohibited food (jello, ice cream, thin soups);Remove water pitcher;Have oral suction available   CHL IP FOLLOW UP RECOMMENDATIONS 02/22/2018 Follow up Recommendations Skilled Nursing facility   [facility] IP FREQUENCY AND DURATION 02/22/2018 Speech Therapy Frequency (ACUTE ONLY) min 3x week Treatment Duration 2 weeks      CHL IP ORAL PHASE 02/22/2018 Oral Phase Impaired Oral - Pudding Teaspoon -- Oral - Pudding Cup -- Oral - Honey Teaspoon Left anterior bolus loss;Right anterior bolus loss;Reduced posterior propulsion;Holding of bolus;Delayed oral transit;Decreased bolus cohesion;Premature spillage Oral - Honey Cup Reduced posterior propulsion;Holding of bolus;Delayed oral transit;Decreased bolus cohesion;Premature spillage Oral - Nectar Teaspoon -- Oral - Nectar Cup -- Oral - Nectar Straw -- Oral - Thin Teaspoon Left anterior bolus loss;Right anterior bolus loss Oral - Thin Cup Left anterior bolus loss;Right anterior bolus loss;Reduced posterior propulsion;Holding of bolus;Delayed oral transit;Decreased bolus cohesion;Premature spillage Oral - Thin Straw -- Oral - Puree Reduced posterior propulsion;Holding of bolus;Delayed oral transit;Decreased bolus cohesion Oral - Mech Soft -- Oral - Regular -- Oral - Multi-Consistency -- Oral -  Pill -- Oral Phase - Comment --  CHL IP PHARYNGEAL PHASE 02/22/2018 Pharyngeal Phase Impaired Pharyngeal-  Pudding Teaspoon -- Pharyngeal -- Pharyngeal- Pudding Cup -- Pharyngeal -- Pharyngeal- Honey Teaspoon Reduced epiglottic inversion;Reduced anterior laryngeal mobility;Reduced laryngeal elevation;Reduced airway/laryngeal closure;Reduced tongue base retraction;Penetration/Aspiration during swallow;Pharyngeal residue - valleculae Pharyngeal Material enters airway, remains ABOVE vocal cords and not ejected out Pharyngeal- Honey Cup Reduced epiglottic inversion;Reduced anterior laryngeal mobility;Reduced laryngeal elevation;Reduced airway/laryngeal closure;Reduced tongue base retraction;Penetration/Aspiration during swallow;Trace aspiration;Pharyngeal residue - valleculae Pharyngeal Material enters airway, passes BELOW cords and not ejected out despite cough attempt by patient Pharyngeal- Nectar Teaspoon -- Pharyngeal -- Pharyngeal- Nectar Cup -- Pharyngeal -- Pharyngeal- Nectar Straw -- Pharyngeal -- Pharyngeal- Thin Teaspoon -- Pharyngeal -- Pharyngeal- Thin Cup Reduced epiglottic inversion;Reduced anterior laryngeal mobility;Reduced laryngeal elevation;Reduced airway/laryngeal closure;Reduced tongue base retraction;Penetration/Aspiration before swallow;Penetration/Aspiration during swallow;Moderate aspiration;Pharyngeal residue - valleculae;Pharyngeal residue - pyriform Pharyngeal Material enters airway, passes BELOW cords and not ejected out despite cough attempt by patient Pharyngeal- Thin Straw -- Pharyngeal -- Pharyngeal- Puree Reduced epiglottic inversion;Reduced anterior laryngeal mobility;Reduced laryngeal elevation;Reduced airway/laryngeal closure;Reduced tongue base retraction;Penetration/Aspiration during swallow;Pharyngeal residue - valleculae Pharyngeal Material enters airway, remains ABOVE vocal cords then ejected out;Material does not enter airway Pharyngeal- Mechanical Soft -- Pharyngeal -- Pharyngeal- Regular -- Pharyngeal -- Pharyngeal- Multi-consistency -- Pharyngeal -- Pharyngeal- Pill -- Pharyngeal  -- Pharyngeal Comment --  CHL IP CERVICAL ESOPHAGEAL PHASE 02/22/2018 Cervical Esophageal Phase WFL Pudding Teaspoon -- Pudding Cup -- Honey Teaspoon -- Honey Cup -- Nectar Teaspoon -- Nectar Cup -- Nectar Straw -- Thin Teaspoon -- Thin Cup -- Thin Straw -- Puree -- Mechanical Soft -- Regular -- Multi-consistency -- Pill -- Cervical Esophageal Comment -- Deneise Lever, MS, CCC-SLP Speech-Language Pathologist [phone] No flowsheet data found. Aliene Altes 02/22/2018, 3:40 PM              Ct Head Code Stroke Wo Contrast  Result Date: 02/21/2018 CLINICAL DATA:  Code stroke. LEFT facial droop with slurred speech. Generalized weakness. Altered mental status. EXAM: CT HEAD WITHOUT CONTRAST TECHNIQUE: Contiguous axial images were obtained from the base of the skull through the vertex without intravenous contrast. COMPARISON:  CT head 09/30/2016. FINDINGS: Brain: No evidence for acute infarction, hemorrhage, mass lesion, hydrocephalus, or extra-axial fluid. Advanced atrophy. Chronic microvascular ischemic change. Vascular: Calcification of the cavernous internal carotid arteries and distal vertebral arteries consistent with cerebrovascular atherosclerotic disease. No signs of intracranial large vessel occlusion. Skull: Normal. Negative for fracture or focal lesion. Sinuses/Orbits: No acute finding. Other: Compared with priors, the RIGHT frontal parenchymal hemorrhage has resolved. ASPECTS [facility] Stroke Program Early CT Score) - Ganglionic level infarction (caudate, lentiform nuclei, internal capsule, insula, M1-M3 cortex): 7 - Supraganglionic infarction (M4-M6 cortex): 3 Total score (0-10 with 10 being normal): 10 IMPRESSION: 1. Atrophy and small vessel disease. No acute intracranial findings. 2. ASPECTS is 10. 3. These results were communicated to Dr. Cheral Marker at 11:03 amon 3/16/2019by text page via the [facility] messaging system. Electronically Signed   By: Staci Righter M.D.   On: 02/21/2018 11:05     Sherene Sires, DO 02/23/2018, 6:04 AM PGY-1, [place] Intern pager: [phone], text pages welcome

## 2018-02-23 NOTE — Progress Notes (Signed)
Subjective:  Patient denies any chest pain or shortness of breath.  No episodes of dizziness.  No further episodes of significant pauses on the monitor today  Objective:  Vital Signs in the last 24 hours: Temp:  [97.4 F (36.3 C)-98.8 F (37.1 C)] 98 F (36.7 C) (03/18 0827) Pulse Rate:  [94-104] 100 (03/18 0827) Resp:  [14-20] 20 (03/18 0827) BP: (127-164)/(41-55) 136/46 (03/18 0827) SpO2:  [93 %-98 %] 93 % (03/18 0827)  Intake/Output from previous day: 03/17 0701 - 03/18 0700 In: -  Out: 900 [Urine:900] Intake/Output from this shift: No intake/output data recorded.  Physical Exam: Neck: no adenopathy, no carotid bruit, no JVD and supple, symmetrical, trachea midline Lungs: clear to auscultation bilaterally Heart: regular rate and rhythm, S1, S2 normal and 2/6 systolic and soft diastolic murmur noted Abdomen: soft, non-tender; bowel sounds normal; no masses,  no organomegaly Extremities: extremities normal, atraumatic, no cyanosis or edema  Lab Results: Recent Labs    02/22/18 0712 02/23/18 0500  WBC 47.2* 43.5*  HGB 10.9* 10.5*  PLT 54* 52*   Recent Labs    02/22/18 0712 02/23/18 0500  NA 136 137  K 4.3 3.8  CL 106 106  CO2 24 21*  GLUCOSE 152* 160*  BUN 12 13  CREATININE 0.83 0.93   No results for input(s): TROPONINI in the last 72 hours.  Invalid input(s): CK, MB Hepatic Function Panel Recent Labs    02/21/18 1105  PROT 5.7*  ALBUMIN 3.5  AST 38  ALT 26  ALKPHOS 91  BILITOT 0.8   Recent Labs    02/22/18 0712  CHOL 173   No results for input(s): PROTIME in the last 72 hours.  Imaging: Imaging results have been reviewed and Dg Chest 2 View  Result Date: 02/21/2018 CLINICAL DATA:  Chest pain EXAM: CHEST - 2 VIEW COMPARISON:  10/31/2017 FINDINGS: Lungs are under aerated. There is focal opacity in the right infrahilar region. No pneumothorax. No pleural effusion. IMPRESSION: Findings are worrisome for right infrahilar airspace disease. Followup  PA and lateral chest X-ray is recommended in 3-4 weeks following trial of antibiotic therapy to ensure resolution and exclude underlying malignancy. Electronically Signed   By: Marybelle Killings M.D.   On: 02/21/2018 13:09   Mr Brain Wo Contrast  Result Date: 02/21/2018 CLINICAL DATA:  Code stroke, mildly altered, unable to transfer. EXAM: MRI HEAD WITHOUT CONTRAST TECHNIQUE: Multiplanar, multiecho pulse sequences of the brain and surrounding structures were obtained without intravenous contrast. COMPARISON:  Code stroke CT earlier today. FINDINGS: Brain: There is a small tubular area of restricted diffusion affecting the RIGHT paramedian pons consistent with acute infarction. No other areas of involvement are seen. There is no hemorrhage, mass lesion, or extra-axial fluid. There is extensive atrophy with prominence of the ventricles, cisterns, and sulci. Hypoattenuation of white matter representing small vessel disease is observed. Vascular: Flow voids are maintained throughout the carotid, basilar, and vertebral arteries. There is a small focus of chronic hemorrhage in the RIGHT frontal lobe, either sequelae of old trauma or ischemia. Skull and upper cervical spine: Normal marrow signal. Sinuses/Orbits: Negative. Other: Correlating with the earlier CT, the infarct is not visible. IMPRESSION: Small area of acute brainstem infarction affecting the RIGHT paramedian pons, without associated large vessel occlusion. This infarction is consistent with a small vessel ischemic insult secondary to hypertension and/or diabetes. Advanced atrophy with chronic microvascular ischemic change elsewhere. No visible acute hemorrhage. Electronically Signed   By: Roderic Ovens.D.  On: 02/21/2018 16:20   Dg Swallowing Func-speech Pathology  Result Date: 02/22/2018 Objective Swallowing Evaluation: Type of Study: MBS-Modified Barium Swallow Study  Patient Details Name: Martha Walters MRN: [id number] Date of Birth: [birth year] Today's  Date: 02/22/2018 Time: SLP Start Time (ACUTE ONLY): 7939 -SLP Stop Time (ACUTE ONLY): 1505 SLP Time Calculation (min) (ACUTE ONLY): 20 min Past Medical History: Past Medical History: Diagnosis Date . Bruises easily  . Clotting disorder ([place])  . Diabetes mellitus without complication ([place])  . Hypertension  . Thyroid disease  Past Surgical History: Past Surgical History: Procedure Laterality Date . trigger finger injection   HPI: Martha Walters a 84 y.o.femalepresenting with AMS. 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 Per MD pt has had PNA recently.  Subjective: Pt arrives to [place] with son and daughter Assessment / Plan / Recommendation CHL IP CLINICAL IMPRESSIONS 02/22/2018 Clinical Impression Patient presents with severe oropharyngeal dysphagia due to sensory, motor and cognitive impairments. Oral stage is characterized by prolonged oral holding, decreased oral manipulation leading to reduced bolus cohesion and delayed oral transit, premature spillage. Swallow initiation delayed, with prolonged pooling in the valleculae (puree) and pyriforms (honey, thin liquids). Pharyngeal stage characterized by mildly decreased base of tongue retraction, decreased hyolaryngeal excursion with delayed, incomplete closure of the laryngeal vestibule. There is penetration before the swallow with thin liquids, with eventual sensed aspiration, and penetration/aspiration during the swallow with honey-thick liquids due to spillage into the airway from liquid pooled in the pyriforms. Pt did sense aspiration, and SLP was able to remove some material via oral suction, though some barium did remain in the trachea. There was mild, shallow penetration with puree which was transient. Pt easily distracted during testing by anterior spillage and environmental commotion, requiring  frequent verbal and tactile cues to refocus and initiate swallow. Son and daughter reviewed the study in real time and SLP explained findings, recommendations and rationale after completion of the exam. Recommend dys 1, pudding thick liquids by teaspoon, with full supervision and assistance, meds crushed. Feeding will be slow and effortful, and I anticipate pt will require significant cuing. Educated son and daughter in strategies including reducing environmental distractions, monitoring for swallow initiation, and cuing techniques. Will follow for tolerance, interventions to improve swallow function, caregiver education, consider repeat instrumental assessment if improvements at bedside. SLP Visit Diagnosis Dysphagia, oral phase (R13.11);Dysphagia, oropharyngeal phase (R13.12);Dysphagia, pharyngeal phase (R13.13) Attention and concentration deficit following -- Frontal lobe and executive function deficit following -- Impact on safety and function Severe aspiration risk   CHL IP TREATMENT RECOMMENDATION 02/22/2018 Treatment Recommendations Therapy as outlined in treatment plan below   Prognosis 02/22/2018 Prognosis for Safe Diet Advancement Fair Barriers to Reach Goals Cognitive deficits;Severity of deficits Barriers/Prognosis Comment -- [facility]L IP DIET RECOMMENDATION 02/22/2018 SLP Diet Recommendations Dysphagia 1 (Puree) solids;Pudding thick liquid Liquid Administration via Spoon Medication Administration Crushed with puree Compensations Slow rate;Small sips/bites;Minimize environmental distractions Postural Changes Seated upright at 90 degrees   CHL IP OTHER RECOMMENDATIONS 02/22/2018 Recommended Consults -- Oral Care Recommendations Oral care BID;Oral care before and after PO Other Recommendations Order thickener from pharmacy;Prohibited food (jello, ice cream, thin soups);Remove water pitcher;Have oral suction available   CHL IP FOLLOW UP RECOMMENDATIONS 02/22/2018 Follow up Recommendations Skilled Nursing facility    [facility] IP FREQUENCY AND DURATION 02/22/2018 Speech Therapy Frequency (ACUTE ONLY) min 3x week Treatment Duration 2 weeks  CHL IP ORAL PHASE 02/22/2018 Oral Phase Impaired Oral - Pudding Teaspoon -- Oral - Pudding Cup -- Oral - Honey Teaspoon Left anterior bolus loss;Right anterior bolus loss;Reduced posterior propulsion;Holding of bolus;Delayed oral transit;Decreased bolus cohesion;Premature spillage Oral - Honey Cup Reduced posterior propulsion;Holding of bolus;Delayed oral transit;Decreased bolus cohesion;Premature spillage Oral - Nectar Teaspoon -- Oral - Nectar Cup -- Oral - Nectar Straw -- Oral - Thin Teaspoon Left anterior bolus loss;Right anterior bolus loss Oral - Thin Cup Left anterior bolus loss;Right anterior bolus loss;Reduced posterior propulsion;Holding of bolus;Delayed oral transit;Decreased bolus cohesion;Premature spillage Oral - Thin Straw -- Oral - Puree Reduced posterior propulsion;Holding of bolus;Delayed oral transit;Decreased bolus cohesion Oral - Mech Soft -- Oral - Regular -- Oral - Multi-Consistency -- Oral - Pill -- Oral Phase - Comment --  CHL IP PHARYNGEAL PHASE 02/22/2018 Pharyngeal Phase Impaired Pharyngeal- Pudding Teaspoon -- Pharyngeal -- Pharyngeal- Pudding Cup -- Pharyngeal -- Pharyngeal- Honey Teaspoon Reduced epiglottic inversion;Reduced anterior laryngeal mobility;Reduced laryngeal elevation;Reduced airway/laryngeal closure;Reduced tongue base retraction;Penetration/Aspiration during swallow;Pharyngeal residue - valleculae Pharyngeal Material enters airway, remains ABOVE vocal cords and not ejected out Pharyngeal- Honey Cup Reduced epiglottic inversion;Reduced anterior laryngeal mobility;Reduced laryngeal elevation;Reduced airway/laryngeal closure;Reduced tongue base retraction;Penetration/Aspiration during swallow;Trace aspiration;Pharyngeal residue - valleculae Pharyngeal Material enters airway, passes BELOW cords and not ejected out despite cough attempt by patient Pharyngeal-  Nectar Teaspoon -- Pharyngeal -- Pharyngeal- Nectar Cup -- Pharyngeal -- Pharyngeal- Nectar Straw -- Pharyngeal -- Pharyngeal- Thin Teaspoon -- Pharyngeal -- Pharyngeal- Thin Cup Reduced epiglottic inversion;Reduced anterior laryngeal mobility;Reduced laryngeal elevation;Reduced airway/laryngeal closure;Reduced tongue base retraction;Penetration/Aspiration before swallow;Penetration/Aspiration during swallow;Moderate aspiration;Pharyngeal residue - valleculae;Pharyngeal residue - pyriform Pharyngeal Material enters airway, passes BELOW cords and not ejected out despite cough attempt by patient Pharyngeal- Thin Straw -- Pharyngeal -- Pharyngeal- Puree Reduced epiglottic inversion;Reduced anterior laryngeal mobility;Reduced laryngeal elevation;Reduced airway/laryngeal closure;Reduced tongue base retraction;Penetration/Aspiration during swallow;Pharyngeal residue - valleculae Pharyngeal Material enters airway, remains ABOVE vocal cords then ejected out;Material does not enter airway Pharyngeal- Mechanical Soft -- Pharyngeal -- Pharyngeal- Regular -- Pharyngeal -- Pharyngeal- Multi-consistency -- Pharyngeal -- Pharyngeal- Pill -- Pharyngeal -- Pharyngeal Comment --  CHL IP CERVICAL ESOPHAGEAL PHASE 02/22/2018 Cervical Esophageal Phase WFL Pudding Teaspoon -- Pudding Cup -- Honey Teaspoon -- Honey Cup -- Nectar Teaspoon -- Nectar Cup -- Nectar Straw -- Thin Teaspoon -- Thin Cup -- Thin Straw -- Puree -- Mechanical Soft -- Regular -- Multi-consistency -- Pill -- Cervical Esophageal Comment -- Deneise Lever, MS, CCC-SLP Speech-Language Pathologist [phone] No flowsheet data found. Aliene Altes 02/22/2018, 3:40 PM              Ct Head Code Stroke Wo Contrast  Result Date: 02/21/2018 CLINICAL DATA:  Code stroke. LEFT facial droop with slurred speech. Generalized weakness. Altered mental status. EXAM: CT HEAD WITHOUT CONTRAST TECHNIQUE: Contiguous axial images were obtained from the base of the skull through the vertex  without intravenous contrast. COMPARISON:  CT head 09/30/2016. FINDINGS: Brain: No evidence for acute infarction, hemorrhage, mass lesion, hydrocephalus, or extra-axial fluid. Advanced atrophy. Chronic microvascular ischemic change. Vascular: Calcification of the cavernous internal carotid arteries and distal vertebral arteries consistent with cerebrovascular atherosclerotic disease. No signs of intracranial large vessel occlusion. Skull: Normal. Negative for fracture or focal lesion. Sinuses/Orbits: No acute finding. Other: Compared with priors, the RIGHT frontal parenchymal hemorrhage has resolved. ASPECTS [facility] Stroke Program Early CT Score) - Ganglionic level infarction (caudate, lentiform nuclei, internal capsule, insula, M1-M3 cortex): 7 - Supraganglionic infarction (M4-M6 cortex): 3 Total score (0-10  with 10 being normal): 10 IMPRESSION: 1. Atrophy and small vessel disease. No acute intracranial findings. 2. ASPECTS is 10. 3. These results were communicated to Dr. Cheral Marker at 11:03 amon 3/16/2019by text page via the [facility] messaging system. Electronically Signed   By: Staci Righter M.D.   On: 02/21/2018 11:05    Cardiac Studies:  Assessment/Plan:  Status postSymptomatic sinus bradycardia with pauses/sinus arrest Sick sinus syndrome Chronic left bundle branch block Acute right para pontine CVA Hypertension Diabetes mellitus CLL Early dementia Thrombocytopenia Hypothyroidism Dysphagia Possible right community-acquired pneumonia Plan Continue present management. Will check 2-D echo   LOS: 2 days    Charolette Forward 02/23/2018, 10:49 AM

## 2018-02-23 NOTE — Progress Notes (Signed)
  Speech Language Pathology Treatment: Dysphagia;Cognitive-Linquistic  Patient Details Name: Martha Walters MRN: [id number] DOB: 09-Jun-1929 Today's Date: 02/23/2018 Time: 5056-9794 SLP Time Calculation (min) (ACUTE ONLY): 28 min  Assessment / Plan / Recommendation Clinical Impression  Skilled treatment for dysarthria/dysphagia with consumption of Dysphagia 1 (puree)/pudding-thick liquids given via tsp with moderate verbal/visual cues provided by SLP to swallow effortfully and with increased speed utilizing tactile/verbal cues intermittently; daughter educated re: swallowing/aspiration precautions with eventual oral suctioning required post-consumption with delayed cough/sneezing initiated by Dysphagia 1/pudding-thick consistencies; dysarthria tx completed utilizing slow rate, over-articulation, pausing and increased intensity with phrases to increase clarity/accuracy of speech with education provided for daughter re: dysarthria tx as well during functional communicative tasks; ST will continue to f/u for diet tolerance and dysarthria tx.  HPI HPI: Martha Walters a 84 y.o.femalepresenting with AMS. 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 Per MD pt has had PNA recently.      SLP Plan  Continue with current plan of care       Recommendations  Diet recommendations: Dysphagia 1 (puree);Pudding-thick liquid Liquids provided via: Teaspoon Medication Administration: Crushed with puree Supervision: Patient able to self feed;Full supervision/cueing for compensatory strategies Compensations: Minimize environmental distractions;Slow rate;Small sips/bites;Effortful swallow Postural Changes and/or Swallow Maneuvers: Seated upright 90 degrees;Upright 30-60 min after meal                Oral Care Recommendations: Oral care BID Follow up  Recommendations: Skilled Nursing facility SLP Visit Diagnosis: Dysphagia, oropharyngeal phase (R13.12) Plan: Continue with current plan of care                      Elvina Sidle, M.S., CCC-SLP 02/23/2018, 5:10 PM

## 2018-02-23 NOTE — Progress Notes (Signed)
Paged Amin. Daughter asked about Coumadin and stated her level should be at 2. Asked if she was taking the med and if another lab was going to be drawn. Orders were placed for a PT. Updated the daughter Kalman Shan that we have SCD's in place for now and her level was therapeutic at 1.47. Also, that the MD will follow up with the team about the Coumadin.

## 2018-02-23 NOTE — Consult Note (Signed)
[place] Nurse wound consult note Reason for Consult: right foot/toe wound Patient and family unclear on how long this has been present.  Wound type: partial thickness, scabbed with serous crust  Pressure Injury POA: NA Measurement:0.3cm x 0.5cm x 0cm  Wound ZCH:YIFOYD crust, not open Drainage (amount, consistency, odor) none Periwound:intact, palpable pulses.  Dressing procedure/placement/frequency: Paint area with betadine for antiseptic and drying effect, cover with dry dressing or bandaid daily.  Discussed POC with patient and bedside nurse.  Re consult if needed, will not follow at this time. Thanks  Takyra Cantrall [employer], RN,CWOCN, CNS, [place] [phone])

## 2018-02-23 NOTE — Progress Notes (Signed)
ANTICOAGULATION CONSULT NOTE - Initial Consult  Pharmacy Consult for warfarin Indication: history of recurrent DVTs  Allergies  Allergen Reactions  . Phenergan [Promethazine Hcl] Other (See Comments)    Over Sedation  . Tetanus Toxoid Swelling  . Avelox [Moxifloxacin] Rash  . Levaquin [Levofloxacin In D5w] Hives  . Levofloxacin Hives  . Xarelto [Rivaroxaban]     Patient Measurements: Height: 5\' 3"  (160 cm) Weight: 184 lb 11.9 oz (83.8 kg) IBW/kg (Calculated) : 52.4  Vital Signs: Temp: 97.5 F (36.4 C) (03/18 1635) Temp Source: Oral (03/18 1635) BP: 126/49 (03/18 1635) Pulse Rate: 94 (03/18 1635)  Labs: Recent Labs    02/21/18 1105 02/21/18 1112 02/22/18 0712 02/23/18 0500  HGB 11.9* 12.2 10.9* 10.5*  HCT 36.5 36.0 33.8* 33.0*  PLT 56*  --  54* 52*  APTT 27  --   --   --   LABPROT 17.7*  --   --  20.8*  INR 1.47  --   --  1.81  CREATININE 0.79 0.70 0.83 0.93    Estimated Creatinine Clearance: 43.7 mL/min (by C-G formula based on SCr of 0.93 mg/dL).   Medical History: Past Medical History:  Diagnosis Date  . Bruises easily   . Clotting disorder ([place])   . Diabetes mellitus without complication ([place])   . Hypertension   . Thyroid disease     Medications:  Scheduled:  . allopurinol  100 mg Oral BH-q7a  . amoxicillin-clavulanate  1 tablet Oral BID WC  . aspirin  300 mg Rectal Daily   Or  . aspirin  325 mg Oral Daily  . atorvastatin  40 mg Oral q1800  . azithromycin  500 mg Oral q1800  . citalopram  10 mg Oral QPM  . insulin aspart  0-5 Units Subcutaneous QHS  . insulin aspart  0-9 Units Subcutaneous TID WC  . levothyroxine  200 mcg Oral QAC breakfast  . [START ON 02/24/2018] pantoprazole  40 mg Oral Daily    Assessment: 79 yof on warfarin PTA for hx of recurrent DVTs - home regimen reported as 10 mg daily. Last dose was on 3/15 at 1900. INR on admission was 1.47.   INR today came back at 1.81 (increased despite no warfarin in hospital d/t stroke  work-out). MRI found small area of acute brainstem infarction affecting R paramedian pons. Okay per medical teams to resume warfarin. On concurrent azithromycin and augmentin, which can impact INR sensitivity. Hgb is 10.5, plts low at 52. No signs/symptoms of bleeding.  Goal of Therapy:  INR 2-3 Monitor platelets by anticoagulation protocol: Yes   Plan:  Will resume warfarin 7.5 mg tonight Will monitor daily INR and CBC Monitor for signs/symptoms of bleeding  Doylene Canard, PharmD Clinical Pharmacist  Pager: [phone] Phone: [phone] 02/23/2018,5:30 PM

## 2018-02-23 NOTE — Progress Notes (Signed)
STROKE TEAM PROGRESS NOTE   HISTORY OF PRESENT ILLNESS (per record)  Martha Walters is an 84 y.o. female who was in her USOH until sometime yesterday, when she was "a little bit off" per EMS personnel discussion with family. She was essentially LKN except for some mild increased confusion when she went to bed last night at 2200. This AM family noted increased confusion and inability to stand for transfer from bed to wheelchair. EMS was then called. On arrival, EMS noted left facial droop, leaning to the left and slurred speech with confusion. CBG was 142. At baseline she cannot walk but can transfer from bed to wheelchair.   EKG obtained by EMS shows sinus arrhythmia with 1st degree AV block and LBBB.  She is on warfarin for bilateral DVTs. PMHx also includes HTN, DM and thyroid disease.     SUBJECTIVE (INTERVAL HISTORY) Her family is not  at the bedside.  Family is  reluctant to have a feeding tube and   and palliative care consult is requested . Therapy has cleared her for dysphagia 1. Diet with pudding thick liquid. Family has refused MRA of the brain and carotid Dopplers and she is not a surgical candidate    OBJECTIVE Temp:  [97.4 F (36.3 C)-98.3 F (36.8 C)] 97.6 F (36.4 C) (03/18 1121) Pulse Rate:  [93-104] 93 (03/18 1121) Cardiac Rhythm: Heart block (03/18 0700) Resp:  [14-20] 20 (03/18 0827) BP: (125-136)/(41-49) 125/49 (03/18 1121) SpO2:  [93 %-98 %] 95 % (03/18 1121)  CBC:  Recent Labs  Lab 02/21/18 1105  02/22/18 0712 02/23/18 0500  WBC 77.5*  --  47.2* 43.5*  NEUTROABS 2.3  --   --   --   HGB 11.9*   < > 10.9* 10.5*  HCT 36.5   < > 33.8* 33.0*  MCV 92.4  --  94.4 95.9  PLT 56*  --  54* 52*   < > = values in this interval not displayed.    Basic Metabolic Panel:  Recent Labs  Lab 02/22/18 0712 02/23/18 0500  NA 136 137  K 4.3 3.8  CL 106 106  CO2 24 21*  GLUCOSE 152* 160*  BUN 12 13  CREATININE 0.83 0.93  CALCIUM 8.2* 7.8*    Lipid Panel:      Component Value Date/Time   CHOL 173 02/22/2018 0712   TRIG 153 (H) 02/22/2018 0712   HDL 27 (L) 02/22/2018 0712   CHOLHDL 6.4 02/22/2018 0712   VLDL 31 02/22/2018 0712   LDLCALC 115 (H) 02/22/2018 0712   HgbA1c:  Lab Results  Component Value Date   HGBA1C 8.8 (H) 02/22/2018   Urine Drug Screen: No results found for: LABOPIA, COCAINSCRNUR, LABBENZ, AMPHETMU, THCU, LABBARB  Alcohol Level No results found for: [facility]  IMAGING   Dg Chest 2 View 02/21/2018 IMPRESSION:  Findings are worrisome for right infrahilar airspace disease. Followup PA and lateral chest X-ray is recommended in 3-4 weeks following trial of antibiotic therapy to ensure resolution and exclude underlying malignancy.     Mr Brain Wo Contrast 02/21/2018 IMPRESSION:  Small area of acute brainstem infarction affecting the RIGHT paramedian pons, without associated large vessel occlusion. This infarction is consistent with a small vessel ischemic insult secondary to hypertension and/or diabetes. Advanced atrophy with chronic microvascular ischemic change elsewhere. No visible acute hemorrhage.    Ct Head Code Stroke Wo Contrast 02/21/2018 IMPRESSION:  1. Atrophy and small vessel disease. No acute intracranial findings.  2. ASPECTS is 10.  Transthoracic Echocardiogram - pending 00/00/00      PHYSICAL EXAM Vitals:   02/23/18 0024 02/23/18 0418 02/23/18 0827 02/23/18 1121  BP: (!) 127/41 (!) 135/43 (!) 136/46 (!) 125/49  Pulse: 96 (!) 104 100 93  Resp: 14 16 20    Temp: (!) 97.4 F (36.3 C) 98.3 F (36.8 C) 98 F (36.7 C) 97.6 F (36.4 C)  TempSrc: Axillary Axillary Oral Oral  SpO2: 97% 98% 93% 95%  Weight:      Height:       Frail elderly Caucasian lady lying comfortably in bed not in distress. . Afebrile. Head is nontraumatic. Neck is supple without bruit.    Cardiac exam no murmur or gallop. Lungs are clear to auscultation. Distal pulses are well felt. Neurological Exam Awake alert oriented to  person only.  Voice is very soft and at times difficult to understand.  Follows midline and two-step commands.  Extraocular movements are full range but slight saccadic dysmetria on left gaze.  Pupils irregular but equal reactive.  Fundi not visualized.  Face is symmetric without weakness.  Tongue midline.  Weak cough and gag.  Motor system exam able to move upper extremities quite well without focal weakness.  Symmetric strength in lower extremities but not able to left up against gravity.  No focal weakness.  Deep tendon reflexes are symmetric.  Sensation intact.  Plantars downgoing.  Gait not tested.          ASSESSMENT/PLAN Ms. Martha Walters is a 84 y.o. female with history of HTN, DM, thyroid disease, CLL and she is on warfarin for bilateral DVTs. She presents with presenting with left-sided weakness, confusion, and slurred speech. She did not receive IV t-PA due to late presentation.  Small area of acute brainstem infarction affecting the Rt paramedian pons 2/2 small vessel disease.  Resultant confusion   CT head - Atrophy and small vessel disease. No acute intracranial findings.   MRI head - Small area of acute brainstem infarction affecting the RIGHT paramedian pons  MRA head - not performed  Carotid Doppler - not ordered  2D Echo - pending  LDL - 115  HgbA1c - 8.8  VTE prophylaxis - SCDs Fall precautions Aspiration precautions DIET - DYS 1 Room service appropriate? Yes; Fluid consistency: Pudding Thick  warfarin daily prior to admission, now on aspirin 300 mg suppository daily  Patient counseled to be compliant with her antithrombotic medications  Ongoing aggressive stroke risk factor management  Therapy recommendations:  SNF recommended  Disposition:  Pending  Hypertension  Stable  Permissive hypertension (OK if < 220/120) but gradually normalize in 5-7 days  Long-term BP goal normotensive  Hyperlipidemia  Home meds: No lipid lowering medications prior to  admission  LDL 115, goal < 70  Consider Lipitor 20 to 40 mg daily  Continue statin at discharge  Diabetes  HgbA1c 8.8, goal < 7.0  Uncontrolled   Other Stroke Risk Factors  Advanced age  Obesity, Body mass index is 32.73 kg/m., recommend weight loss, diet and exercise as appropriate    Other Active Problems  Abnormal chest x-ray -follow-up recommended as noted above.  History of a clotting disorder  Bilateral DVTs -Coumadin PTA - INR 1.47 on day of admission (Saturday)  NPO  CAP - Zithromax and Rocephin  Anemia /thrombocytopenia /leukocytosis - CLL history    Plan / Recommendations    Continue statin for elevated lipidsand warfarin for history of recurrent DVTs.no need for separate aspirin unless there is significant coronary artery  disease. Transfer to skilled nursing facility and medically stable.   No further stroke workup necessary.Stroke team will sign off. Kindly call for questions.   Hospital day # 2  I Antony Contras, MD Medical Director [facility] Pager: [phone] 02/23/2018 3:12 PM   To contact Stroke Continuity provider, please refer to [email or web address] After hours, contact General Neurology

## 2018-02-23 NOTE — Progress Notes (Signed)
Palliative Medicine consult noted. Due to high referral volume, there may be a delay seeing this patient. Please call the Palliative Medicine Team office at [phone] if recommendations are needed in the interim.  Thank you for inviting [place] to see this patient.  Marjie Skiff Luke Rigsbee, RN, BSN, [facility] Palliative Medicine Team 02/23/2018 11:51 AM Office [phone]

## 2018-02-23 NOTE — Discharge Summary (Signed)
[facility] Discharge Summary  Patient name: Martha Walters Medical record number: [id number] Date of birth: 29-Oct-1929 Age: 84 y.o. Gender: female Date of Admission: 02/21/2018  Date of Discharge: 02/27/18 Admitting Physician: Zenia Resides, MD  Primary Care Provider: Egbert Garibaldi, PA-C Consultants: cardiology, neurology  Indication for Hospitalization: ischemic CVA  Discharge Diagnoses/Problem List:  Acute R paramedian pons CVA New Afib w/ RVR Reflux w/ vomiting when laid flat CLL Hx of GI ulcer R5th lateral toe wound Hypothyroidism Dementia Gout T2DM  Disposition: to SNF  Discharge Condition: stable  Discharge Exam:  General: frail elderly female in NAD, unchanged from prior day.  Improved eating. Cardiovascular: rate ~110 which is within cards recs, 3/6 systolic murmur Respiratory: CTAB, easy WOB Abdomen: SNTND, +BS Extremities: some puffiness in hands, rings were a little tight but with distal sensation/perfusion intact Neuro: persistent L facial droop, mild confusion and slurring of words unchanged from prior, sensation intact bilaterally  PE performed by Dr. Criss Rosales on day of discharge  Brief Hospital Course:  Patient presented to ED with new assymetric facial droop and increased confusion with global weakness in setting of pneumonia vs pulmonary malignancy.  Code stroke called and CT without acute bleed, MRI with R pons CVA.  Contributing etiologies include CLL and subtherapeutic INR.  Patient was started on aspirin immediately and restarted on warfarin 2 days later.  Patient experienced only slight clinical improvement during stay with persistent concerns swallowing requiring a Dysphagia 1 diet, persistent weakness and facial droop.   She also experienced new diagnosis of Afib on telemonitor which was treated with low dose metoprolol 12.5BID out of concern for her LBBB and occasional sinus pauses.  Both of these concerns were discussed with  family.  They consistent declined invasive intervention and also requested d/c of statin due to hx of GI ulcers.  PT/OT recommened SNF placement and family agreed.  Issues for Follow Up:  1. Patient and family have refused statin due to complaints of past joint aches, risks were discussed and they were consistent in their decision. 2. Patient with new Afib and started on 12.5mg  metoprolol.  Given history of sinus pauses as a competing risk, plan was to not aggresively seek rate control and only offer low dose metoprolol as rates began to climb >120 per cardiology. Cardiology recommending that she may need more rate control. Please follow up with cardiology before increasing metoprolol or any other blocking agent.  3. Followup PA and lateral chest X-ray is recommended in 3-4 weeks following trial of antibiotic therapy to ensure resolution and exclude underlying malignancy. 4. Per neurology, patient does not require aspirin above warfarin and no further stroke workup is needed as family is declining  5. Permissive HTN should be gradually ended ~3/23 6. Patient to avoid AV blocking meds, will need EP consult- if pt agrees once neuro status stable and PNA clears. 7. It is very important for patient to be sitting upright when eating and has dysphagia 1 diet and nectar thick liquids. She will need continuing speech language therapy evaluations. 8. Patient's allopurinol was stopped in setting of acute illness.  Please re-evaluate appropriate timing to restart. 9. Warfarin will need to be tirtrated with INR management.  Her daily dose the last day in the hospital was 5mg .  Significant Procedures: MBS,   Significant Labs and Imaging:  Recent Labs  Lab 02/25/18 0538 02/26/18 0651 02/27/18 0356  WBC 52.8* 54.5* 66.3*  HGB 10.8* 10.9* 11.7*  HCT 33.7* 34.5* 36.9  PLT 47* 46* 53*   Recent Labs  Lab 02/21/18 1105  02/23/18 0500 02/24/18 0629 02/25/18 0538 02/26/18 0651 02/27/18 0356  NA 134*   <  > 137 135 135 139 140  K 4.7   < > 3.8 4.0 4.3 4.4 3.9  CL 101   < > 106 105 106 108 108  CO2 25   < > 21* 19* 22 22 23   GLUCOSE 137*   < > 160* 190* 178* 209* 221*  BUN 12   < > 13 14 14 14 14   CREATININE 0.79   < > 0.93 0.96 0.88 0.83 0.81  CALCIUM 9.0   < > 7.8* 7.8* 7.8* 8.0* 8.3*  MG  --   --   --   --  1.8  --   --   ALKPHOS 91  --   --   --   --   --   --   AST 38  --   --   --   --   --   --   ALT 26  --   --   --   --   --   --   ALBUMIN 3.5  --   --   --   --   --   --    < > = values in this interval not displayed.    Dg Chest 2 View  Result Date: 02/21/2018 CLINICAL DATA:  Chest pain EXAM: CHEST - 2 VIEW COMPARISON:  10/31/2017 FINDINGS: Lungs are under aerated. There is focal opacity in the right infrahilar region. No pneumothorax. No pleural effusion. IMPRESSION: Findings are worrisome for right infrahilar airspace disease. Followup PA and lateral chest X-ray is recommended in 3-4 weeks following trial of antibiotic therapy to ensure resolution and exclude underlying malignancy. Electronically Signed   By: Marybelle Killings M.D.   On: 02/21/2018 13:09   Mr Brain Wo Contrast  Result Date: 02/21/2018 CLINICAL DATA:  Code stroke, mildly altered, unable to transfer. EXAM: MRI HEAD WITHOUT CONTRAST TECHNIQUE: Multiplanar, multiecho pulse sequences of the brain and surrounding structures were obtained without intravenous contrast. COMPARISON:  Code stroke CT earlier today. FINDINGS: Brain: There is a small tubular area of restricted diffusion affecting the RIGHT paramedian pons consistent with acute infarction. No other areas of involvement are seen. There is no hemorrhage, mass lesion, or extra-axial fluid. There is extensive atrophy with prominence of the ventricles, cisterns, and sulci. Hypoattenuation of white matter representing small vessel disease is observed. Vascular: Flow voids are maintained throughout the carotid, basilar, and vertebral arteries. There is a small focus of chronic  hemorrhage in the RIGHT frontal lobe, either sequelae of old trauma or ischemia. Skull and upper cervical spine: Normal marrow signal. Sinuses/Orbits: Negative. Other: Correlating with the earlier CT, the infarct is not visible. IMPRESSION: Small area of acute brainstem infarction affecting the RIGHT paramedian pons, without associated large vessel occlusion. This infarction is consistent with a small vessel ischemic insult secondary to hypertension and/or diabetes. Advanced atrophy with chronic microvascular ischemic change elsewhere. No visible acute hemorrhage. Electronically Signed   By: Staci Righter M.D.   On: 02/21/2018 16:20   Dg Swallowing Func-speech Pathology  Result Date: 02/22/2018 Objective Swallowing Evaluation: Type of Study: MBS-Modified Barium Swallow Study  Patient Details Name: Trace Cederberg MRN: [id number] Date of Birth: 20-Nov-1929 Today's Date: 02/22/2018 Time: SLP Start Time (ACUTE ONLY): 5465 -SLP Stop Time (ACUTE ONLY): 1505 SLP Time Calculation (min) (ACUTE ONLY): 20 min Past  Medical History: Past Medical History: Diagnosis Date . Bruises easily  . Clotting disorder ([place])  . Diabetes mellitus without complication ([place])  . Hypertension  . Thyroid disease  Past Surgical History: Past Surgical History: Procedure Laterality Date . trigger finger injection   HPI: Jessicia Napolitano a 84 y.o.femalepresenting with AMS. 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 Per MD pt has had PNA recently.  Subjective: Pt arrives to [place] with son and daughter Assessment / Plan / Recommendation CHL IP CLINICAL IMPRESSIONS 02/22/2018 Clinical Impression Patient presents with severe oropharyngeal dysphagia due to sensory, motor and cognitive impairments. Oral stage is characterized by prolonged oral holding, decreased oral manipulation leading to reduced bolus  cohesion and delayed oral transit, premature spillage. Swallow initiation delayed, with prolonged pooling in the valleculae (puree) and pyriforms (honey, thin liquids). Pharyngeal stage characterized by mildly decreased base of tongue retraction, decreased hyolaryngeal excursion with delayed, incomplete closure of the laryngeal vestibule. There is penetration before the swallow with thin liquids, with eventual sensed aspiration, and penetration/aspiration during the swallow with honey-thick liquids due to spillage into the airway from liquid pooled in the pyriforms. Pt did sense aspiration, and SLP was able to remove some material via oral suction, though some barium did remain in the trachea. There was mild, shallow penetration with puree which was transient. Pt easily distracted during testing by anterior spillage and environmental commotion, requiring frequent verbal and tactile cues to refocus and initiate swallow. Son and daughter reviewed the study in real time and SLP explained findings, recommendations and rationale after completion of the exam. Recommend dys 1, pudding thick liquids by teaspoon, with full supervision and assistance, meds crushed. Feeding will be slow and effortful, and I anticipate pt will require significant cuing. Educated son and daughter in strategies including reducing environmental distractions, monitoring for swallow initiation, and cuing techniques. Will follow for tolerance, interventions to improve swallow function, caregiver education, consider repeat instrumental assessment if improvements at bedside. SLP Visit Diagnosis Dysphagia, oral phase (R13.11);Dysphagia, oropharyngeal phase (R13.12);Dysphagia, pharyngeal phase (R13.13) Attention and concentration deficit following -- Frontal lobe and executive function deficit following -- Impact on safety and function Severe aspiration risk   CHL IP TREATMENT RECOMMENDATION 02/22/2018 Treatment Recommendations Therapy as outlined in  treatment plan below   Prognosis 02/22/2018 Prognosis for Safe Diet Advancement Fair Barriers to Reach Goals Cognitive deficits;Severity of deficits Barriers/Prognosis Comment -- CHL IP DIET RECOMMENDATION 02/22/2018 SLP Diet Recommendations Dysphagia 1 (Puree) solids;Pudding thick liquid Liquid Administration via Spoon Medication Administration Crushed with puree Compensations Slow rate;Small sips/bites;Minimize environmental distractions Postural Changes Seated upright at 90 degrees   CHL IP OTHER RECOMMENDATIONS 02/22/2018 Recommended Consults -- Oral Care Recommendations Oral care BID;Oral care before and after PO Other Recommendations Order thickener from pharmacy;Prohibited food (jello, ice cream, thin soups);Remove water pitcher;Have oral suction available   CHL IP FOLLOW UP RECOMMENDATIONS 02/22/2018 Follow up Recommendations Skilled Nursing facility   [facility] IP FREQUENCY AND DURATION 02/22/2018 Speech Therapy Frequency (ACUTE ONLY) min 3x week Treatment Duration 2 weeks      CHL IP ORAL PHASE 02/22/2018 Oral Phase Impaired Oral - Pudding Teaspoon -- Oral - Pudding Cup -- Oral - Honey Teaspoon Left anterior bolus loss;Right anterior bolus loss;Reduced posterior propulsion;Holding of bolus;Delayed oral transit;Decreased bolus cohesion;Premature spillage Oral - Honey Cup Reduced posterior propulsion;Holding of bolus;Delayed oral transit;Decreased bolus cohesion;Premature spillage Oral - Nectar Teaspoon -- Oral -  Nectar Cup -- Oral - Nectar Straw -- Oral - Thin Teaspoon Left anterior bolus loss;Right anterior bolus loss Oral - Thin Cup Left anterior bolus loss;Right anterior bolus loss;Reduced posterior propulsion;Holding of bolus;Delayed oral transit;Decreased bolus cohesion;Premature spillage Oral - Thin Straw -- Oral - Puree Reduced posterior propulsion;Holding of bolus;Delayed oral transit;Decreased bolus cohesion Oral - Mech Soft -- Oral - Regular -- Oral - Multi-Consistency -- Oral - Pill -- Oral Phase - Comment  --  CHL IP PHARYNGEAL PHASE 02/22/2018 Pharyngeal Phase Impaired Pharyngeal- Pudding Teaspoon -- Pharyngeal -- Pharyngeal- Pudding Cup -- Pharyngeal -- Pharyngeal- Honey Teaspoon Reduced epiglottic inversion;Reduced anterior laryngeal mobility;Reduced laryngeal elevation;Reduced airway/laryngeal closure;Reduced tongue base retraction;Penetration/Aspiration during swallow;Pharyngeal residue - valleculae Pharyngeal Material enters airway, remains ABOVE vocal cords and not ejected out Pharyngeal- Honey Cup Reduced epiglottic inversion;Reduced anterior laryngeal mobility;Reduced laryngeal elevation;Reduced airway/laryngeal closure;Reduced tongue base retraction;Penetration/Aspiration during swallow;Trace aspiration;Pharyngeal residue - valleculae Pharyngeal Material enters airway, passes BELOW cords and not ejected out despite cough attempt by patient Pharyngeal- Nectar Teaspoon -- Pharyngeal -- Pharyngeal- Nectar Cup -- Pharyngeal -- Pharyngeal- Nectar Straw -- Pharyngeal -- Pharyngeal- Thin Teaspoon -- Pharyngeal -- Pharyngeal- Thin Cup Reduced epiglottic inversion;Reduced anterior laryngeal mobility;Reduced laryngeal elevation;Reduced airway/laryngeal closure;Reduced tongue base retraction;Penetration/Aspiration before swallow;Penetration/Aspiration during swallow;Moderate aspiration;Pharyngeal residue - valleculae;Pharyngeal residue - pyriform Pharyngeal Material enters airway, passes BELOW cords and not ejected out despite cough attempt by patient Pharyngeal- Thin Straw -- Pharyngeal -- Pharyngeal- Puree Reduced epiglottic inversion;Reduced anterior laryngeal mobility;Reduced laryngeal elevation;Reduced airway/laryngeal closure;Reduced tongue base retraction;Penetration/Aspiration during swallow;Pharyngeal residue - valleculae Pharyngeal Material enters airway, remains ABOVE vocal cords then ejected out;Material does not enter airway Pharyngeal- Mechanical Soft -- Pharyngeal -- Pharyngeal- Regular -- Pharyngeal --  Pharyngeal- Multi-consistency -- Pharyngeal -- Pharyngeal- Pill -- Pharyngeal -- Pharyngeal Comment --  CHL IP CERVICAL ESOPHAGEAL PHASE 02/22/2018 Cervical Esophageal Phase WFL Pudding Teaspoon -- Pudding Cup -- Honey Teaspoon -- Honey Cup -- Nectar Teaspoon -- Nectar Cup -- Nectar Straw -- Thin Teaspoon -- Thin Cup -- Thin Straw -- Puree -- Mechanical Soft -- Regular -- Multi-consistency -- Pill -- Cervical Esophageal Comment -- Deneise Lever, MS, CCC-SLP Speech-Language Pathologist [phone] No flowsheet data found. Aliene Altes 02/22/2018, 3:40 PM              Ct Head Code Stroke Wo Contrast  Result Date: 02/21/2018 CLINICAL DATA:  Code stroke. LEFT facial droop with slurred speech. Generalized weakness. Altered mental status. EXAM: CT HEAD WITHOUT CONTRAST TECHNIQUE: Contiguous axial images were obtained from the base of the skull through the vertex without intravenous contrast. COMPARISON:  CT head 09/30/2016. FINDINGS: Brain: No evidence for acute infarction, hemorrhage, mass lesion, hydrocephalus, or extra-axial fluid. Advanced atrophy. Chronic microvascular ischemic change. Vascular: Calcification of the cavernous internal carotid arteries and distal vertebral arteries consistent with cerebrovascular atherosclerotic disease. No signs of intracranial large vessel occlusion. Skull: Normal. Negative for fracture or focal lesion. Sinuses/Orbits: No acute finding. Other: Compared with priors, the RIGHT frontal parenchymal hemorrhage has resolved. ASPECTS [facility] Stroke Program Early CT Score) - Ganglionic level infarction (caudate, lentiform nuclei, internal capsule, insula, M1-M3 cortex): 7 - Supraganglionic infarction (M4-M6 cortex): 3 Total score (0-10 with 10 being normal): 10 IMPRESSION: 1. Atrophy and small vessel disease. No acute intracranial findings. 2. ASPECTS is 10. 3. These results were communicated to Dr. Cheral Marker at 11:03 amon 3/16/2019by text page via the [facility] messaging system.  Electronically Signed   By: Staci Righter M.D.   On: 02/21/2018 11:05    Results/Tests Pending  at Time of Discharge:   Discharge Medications:  Allergies as of 02/27/2018      Reactions   Phenergan [promethazine Hcl] Other (See Comments)   Over Sedation   Tetanus Toxoid Swelling   Avelox [moxifloxacin] Rash   Levaquin [levofloxacin In D5w] Hives   Levofloxacin Hives   Xarelto [rivaroxaban]       Medication List    STOP taking these medications   allopurinol 100 MG tablet Commonly known as:  ZYLOPRIM   amoxicillin 500 MG capsule Commonly known as:  AMOXIL   CALTRATE 600+D PO   glipiZIDE 5 MG 24 hr tablet Commonly known as:  GLUCOTROL XL   metoprolol succinate 50 MG 24 hr tablet Commonly known as:  TOPROL-XL   nystatin powder Commonly known as:  MYCOSTATIN/NYSTOP   OVER THE COUNTER MEDICATION   ranitidine 150 MG tablet Commonly known as:  ZANTAC   Red Yeast Rice 600 MG Caps     TAKE these medications   amoxicillin-clavulanate 500-125 MG tablet Commonly known as:  AUGMENTIN Take 1 tablet (500 mg total) by mouth 2 (two) times daily with a meal for 3 days.   bisacodyl 10 MG suppository Commonly known as:  DULCOLAX Place 1 suppository (10 mg total) rectally daily as needed for moderate constipation.   CENTRUM SILVER PO Take 1 tablet by mouth daily.   citalopram 10 MG tablet Commonly known as:  CELEXA Take 10 mg by mouth every evening.   famotidine 10 MG tablet Commonly known as:  PEPCID Take 1 tablet (10 mg total) by mouth 2 (two) times daily.   insulin aspart 100 UNIT/ML injection Commonly known as:  novoLOG Inject 0-9 Units into the skin 3 (three) times daily with meals. Sliding scale CBG 70 - 120: 0 units CBG 121 - 150: 1 unit,  CBG 151 - 200: 2 units,  CBG 201 - 250: 3 units,  CBG 251 - 300: 5 units,  CBG 301 - 350: 7 units,  CBG 351 - 400: 9 units   CBG > 400: 9 units and notify your MD   insulin aspart 100 UNIT/ML injection Commonly known as:   novoLOG Inject 5 Units into the skin 3 (three) times daily with meals.   insulin glargine 100 UNIT/ML injection Commonly known as:  LANTUS Inject 0.05 mLs (5 Units total) into the skin daily. Start taking on:  02/28/2018 What changed:    how much to take  when to take this   LEUKERAN 2 MG tablet Generic drug:  chlorambucil Take 2 mg by mouth See admin instructions. Take 2 mg by mouth on Monday, Wednesday, Friday and Sunday.   levothyroxine 200 MCG tablet Commonly known as:  SYNTHROID, LEVOTHROID Take 200 mcg by mouth daily before breakfast.   metoprolol tartrate 25 MG tablet Commonly known as:  LOPRESSOR Take 0.5 tablets (12.5 mg total) by mouth 2 (two) times daily.   omega-3 acid ethyl esters 1 g capsule Commonly known as:  LOVAZA Take 1 g by mouth 2 (two) times daily.   polyethylene glycol packet Commonly known as:  MIRALAX / GLYCOLAX Take 17 g by mouth daily as needed for moderate constipation.   warfarin 5 MG tablet Commonly known as:  COUMADIN Take 1 tablet (5 mg total) by mouth one time only at 6 PM. MANAGE INR AND ADJUST AS  NEEDED, left [place] on 5mg  last day of admission What changed:    medication strength  how much to take  when to take this  additional instructions       Discharge Instructions: Please refer to Patient Instructions section of EMR for full details.  Patient was counseled important signs and symptoms that should prompt return to medical care, changes in medications, dietary instructions, activity restrictions, and follow up appointments.   Follow-Up Appointments:   Paco Cislo, [place], DO 02/27/2018, 2:27 PM PGY-1, [place]

## 2018-02-24 DIAGNOSIS — Z66 Do not resuscitate: Secondary | ICD-10-CM

## 2018-02-24 DIAGNOSIS — Z515 Encounter for palliative care: Secondary | ICD-10-CM

## 2018-02-24 DIAGNOSIS — R531 Weakness: Secondary | ICD-10-CM

## 2018-02-24 LAB — CBC
HCT: 35.3 % — ABNORMAL LOW (ref 36.0–46.0)
Hemoglobin: 11.2 g/dL — ABNORMAL LOW (ref 12.0–15.0)
MCH: 30.4 pg (ref 26.0–34.0)
MCHC: 31.7 g/dL (ref 30.0–36.0)
MCV: 95.7 fL (ref 78.0–100.0)
Platelets: 47 10*3/uL — ABNORMAL LOW (ref 150–400)
RBC: 3.69 MIL/uL — ABNORMAL LOW (ref 3.87–5.11)
RDW: 17.1 % — AB (ref 11.5–15.5)
WBC: 42.3 10*3/uL — AB (ref 4.0–10.5)

## 2018-02-24 LAB — BASIC METABOLIC PANEL
ANION GAP: 11 (ref 5–15)
BUN: 14 mg/dL (ref 6–20)
CALCIUM: 7.8 mg/dL — AB (ref 8.9–10.3)
CO2: 19 mmol/L — AB (ref 22–32)
Chloride: 105 mmol/L (ref 101–111)
Creatinine, Ser: 0.96 mg/dL (ref 0.44–1.00)
GFR calc Af Amer: 60 mL/min — ABNORMAL LOW (ref >60)
GFR calc non Af Amer: 52 mL/min — ABNORMAL LOW (ref >60)
Glucose, Bld: 190 mg/dL — ABNORMAL HIGH (ref 65–99)
POTASSIUM: 4 mmol/L (ref 3.5–5.1)
Sodium: 135 mmol/L (ref 135–145)

## 2018-02-24 LAB — PROTIME-INR
INR: 1.77
Prothrombin Time: 20.5 seconds — ABNORMAL HIGH (ref 11.4–15.2)

## 2018-02-24 LAB — GLUCOSE, CAPILLARY
GLUCOSE-CAPILLARY: 201 mg/dL — AB (ref 65–99)
GLUCOSE-CAPILLARY: 183 mg/dL — AB (ref 65–99)
GLUCOSE-CAPILLARY: 209 mg/dL — AB (ref 65–99)
Glucose-Capillary: 164 mg/dL — ABNORMAL HIGH (ref 65–99)

## 2018-02-24 LAB — ECHOCARDIOGRAM COMPLETE
Height: 63 in
WEIGHTICAEL: 2955.93 [oz_av]

## 2018-02-24 MED ORDER — POLYETHYLENE GLYCOL 3350 17 G PO PACK: 17.0000 g | PACK | Freq: Every day | ORAL | Status: DC | PRN

## 2018-02-24 MED ORDER — POLYETHYLENE GLYCOL 3350 17 G PO PACK: 17.0000 g | PACK | Freq: Every day | ORAL | Status: DC

## 2018-02-24 MED ORDER — BISACODYL 10 MG RE SUPP
10.0000 mg | Freq: Every day | RECTAL | Status: DC | PRN
  Administered 2018-02-24: 10 mg via RECTAL
  Filled 2018-02-24: qty 1

## 2018-02-24 MED ORDER — WARFARIN SODIUM 7.5 MG PO TABS
7.5000 mg | ORAL_TABLET | Freq: Once | ORAL | Status: AC
  Administered 2018-02-24: 7.5 mg via ORAL
  Filled 2018-02-24: qty 1

## 2018-02-24 NOTE — Progress Notes (Signed)
Subjective:  Patient denies any chest pain or shortness of breath and noted to be in A. Fib with RVR today. No further pauses noted.  Objective:  Vital Signs in the last 24 hours: Temp:  [97.5 F (36.4 C)-99.5 F (37.5 C)] 98.4 F (36.9 C) (03/19 0928) Pulse Rate:  [93-112] 112 (03/19 0928) Resp:  [20] 20 (03/19 0928) BP: (115-154)/(38-57) 145/57 (03/19 0928) SpO2:  [91 %-97 %] 94 % (03/19 0928)  Intake/Output from previous day: 03/18 0701 - 03/19 0700 In: 2756.7 [I.V.:2756.7] Out: 350 [Urine:350] Intake/Output from this shift: No intake/output data recorded.  Physical Exam: Neck: no adenopathy, no carotid bruit, no JVD and supple, symmetrical, trachea midline Lungs: clear to auscultation bilaterally Heart: irregularly irregular rhythm, S1, S2 normal and 2/6 systolic and soft diastolic murmur noted Abdomen: soft, non-tender; bowel sounds normal; no masses,  no organomegaly Extremities: extremities normal, atraumatic, no cyanosis or edema  Lab Results: Recent Labs    02/23/18 0500 02/24/18 0629  WBC 43.5* 42.3*  HGB 10.5* 11.2*  PLT 52* 47*   Recent Labs    02/23/18 0500 02/24/18 0629  NA 137 135  K 3.8 4.0  CL 106 105  CO2 21* 19*  GLUCOSE 160* 190*  BUN 13 14  CREATININE 0.93 0.96   No results for input(s): TROPONINI in the last 72 hours.  Invalid input(s): CK, MB Hepatic Function Panel Recent Labs    02/21/18 1105  PROT 5.7*  ALBUMIN 3.5  AST 38  ALT 26  ALKPHOS 91  BILITOT 0.8   Recent Labs    02/22/18 0712  CHOL 173   No results for input(s): PROTIME in the last 72 hours.  Imaging: Imaging results have been reviewed and Dg Swallowing Func-speech Pathology  Result Date: 02/22/2018 Objective Swallowing Evaluation: Type of Study: MBS-Modified Barium Swallow Study  Patient Details Name: Starleen Trussell MRN: [id number] Date of Birth: [birth year] Today's Date: 02/22/2018 Time: SLP Start Time (ACUTE ONLY): 4098 -SLP Stop Time (ACUTE ONLY): 1505 SLP Time  Calculation (min) (ACUTE ONLY): 20 min Past Medical History: Past Medical History: Diagnosis Date . Bruises easily  . Clotting disorder ([place])  . Diabetes mellitus without complication ([place])  . Hypertension  . Thyroid disease  Past Surgical History: Past Surgical History: Procedure Laterality Date . trigger finger injection   HPI: Alvia Jablonski a 84 y.o.femalepresenting with AMS. 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 Per MD pt has had PNA recently.  Subjective: Pt arrives to [facility] with son and daughter Assessment / Plan / Recommendation CHL IP CLINICAL IMPRESSIONS 02/22/2018 Clinical Impression Patient presents with severe oropharyngeal dysphagia due to sensory, motor and cognitive impairments. Oral stage is characterized by prolonged oral holding, decreased oral manipulation leading to reduced bolus cohesion and delayed oral transit, premature spillage. Swallow initiation delayed, with prolonged pooling in the valleculae (puree) and pyriforms (honey, thin liquids). Pharyngeal stage characterized by mildly decreased base of tongue retraction, decreased hyolaryngeal excursion with delayed, incomplete closure of the laryngeal vestibule. There is penetration before the swallow with thin liquids, with eventual sensed aspiration, and penetration/aspiration during the swallow with honey-thick liquids due to spillage into the airway from liquid pooled in the pyriforms. Pt did sense aspiration, and SLP was able to remove some material via oral suction, though some barium did remain in the trachea. There was mild, shallow penetration with puree which was transient. Pt  easily distracted during testing by anterior spillage and environmental commotion, requiring frequent verbal and tactile cues to refocus and initiate swallow. Son and daughter reviewed the study in  real time and SLP explained findings, recommendations and rationale after completion of the exam. Recommend dys 1, pudding thick liquids by teaspoon, with full supervision and assistance, meds crushed. Feeding will be slow and effortful, and I anticipate pt will require significant cuing. Educated son and daughter in strategies including reducing environmental distractions, monitoring for swallow initiation, and cuing techniques. Will follow for tolerance, interventions to improve swallow function, caregiver education, consider repeat instrumental assessment if improvements at bedside. SLP Visit Diagnosis Dysphagia, oral phase (R13.11);Dysphagia, oropharyngeal phase (R13.12);Dysphagia, pharyngeal phase (R13.13) Attention and concentration deficit following -- Frontal lobe and executive function deficit following -- Impact on safety and function Severe aspiration risk   CHL IP TREATMENT RECOMMENDATION 02/22/2018 Treatment Recommendations Therapy as outlined in treatment plan below   Prognosis 02/22/2018 Prognosis for Safe Diet Advancement Fair Barriers to Reach Goals Cognitive deficits;Severity of deficits Barriers/Prognosis Comment -- CHL IP DIET RECOMMENDATION 02/22/2018 SLP Diet Recommendations Dysphagia 1 (Puree) solids;Pudding thick liquid Liquid Administration via Spoon Medication Administration Crushed with puree Compensations Slow rate;Small sips/bites;Minimize environmental distractions Postural Changes Seated upright at 90 degrees   CHL IP OTHER RECOMMENDATIONS 02/22/2018 Recommended Consults -- Oral Care Recommendations Oral care BID;Oral care before and after PO Other Recommendations Order thickener from pharmacy;Prohibited food (jello, ice cream, thin soups);Remove water pitcher;Have oral suction available   CHL IP FOLLOW UP RECOMMENDATIONS 02/22/2018 Follow up Recommendations Skilled Nursing facility   [facility] IP FREQUENCY AND DURATION 02/22/2018 Speech Therapy Frequency (ACUTE ONLY) min 3x week Treatment  Duration 2 weeks      CHL IP ORAL PHASE 02/22/2018 Oral Phase Impaired Oral - Pudding Teaspoon -- Oral - Pudding Cup -- Oral - Honey Teaspoon Left anterior bolus loss;Right anterior bolus loss;Reduced posterior propulsion;Holding of bolus;Delayed oral transit;Decreased bolus cohesion;Premature spillage Oral - Honey Cup Reduced posterior propulsion;Holding of bolus;Delayed oral transit;Decreased bolus cohesion;Premature spillage Oral - Nectar Teaspoon -- Oral - Nectar Cup -- Oral - Nectar Straw -- Oral - Thin Teaspoon Left anterior bolus loss;Right anterior bolus loss Oral - Thin Cup Left anterior bolus loss;Right anterior bolus loss;Reduced posterior propulsion;Holding of bolus;Delayed oral transit;Decreased bolus cohesion;Premature spillage Oral - Thin Straw -- Oral - Puree Reduced posterior propulsion;Holding of bolus;Delayed oral transit;Decreased bolus cohesion Oral - Mech Soft -- Oral - Regular -- Oral - Multi-Consistency -- Oral - Pill -- Oral Phase - Comment --  CHL IP PHARYNGEAL PHASE 02/22/2018 Pharyngeal Phase Impaired Pharyngeal- Pudding Teaspoon -- Pharyngeal -- Pharyngeal- Pudding Cup -- Pharyngeal -- Pharyngeal- Honey Teaspoon Reduced epiglottic inversion;Reduced anterior laryngeal mobility;Reduced laryngeal elevation;Reduced airway/laryngeal closure;Reduced tongue base retraction;Penetration/Aspiration during swallow;Pharyngeal residue - valleculae Pharyngeal Material enters airway, remains ABOVE vocal cords and not ejected out Pharyngeal- Honey Cup Reduced epiglottic inversion;Reduced anterior laryngeal mobility;Reduced laryngeal elevation;Reduced airway/laryngeal closure;Reduced tongue base retraction;Penetration/Aspiration during swallow;Trace aspiration;Pharyngeal residue - valleculae Pharyngeal Material enters airway, passes BELOW cords and not ejected out despite cough attempt by patient Pharyngeal- Nectar Teaspoon -- Pharyngeal -- Pharyngeal- Nectar Cup -- Pharyngeal -- Pharyngeal- Nectar Straw  -- Pharyngeal -- Pharyngeal- Thin Teaspoon -- Pharyngeal -- Pharyngeal- Thin Cup Reduced epiglottic inversion;Reduced anterior laryngeal mobility;Reduced laryngeal elevation;Reduced airway/laryngeal closure;Reduced tongue base retraction;Penetration/Aspiration before swallow;Penetration/Aspiration during swallow;Moderate aspiration;Pharyngeal residue - valleculae;Pharyngeal residue - pyriform Pharyngeal Material enters airway, passes BELOW cords and not ejected out despite cough attempt by patient Pharyngeal- Thin Straw -- Pharyngeal -- Pharyngeal- Puree Reduced  epiglottic inversion;Reduced anterior laryngeal mobility;Reduced laryngeal elevation;Reduced airway/laryngeal closure;Reduced tongue base retraction;Penetration/Aspiration during swallow;Pharyngeal residue - valleculae Pharyngeal Material enters airway, remains ABOVE vocal cords then ejected out;Material does not enter airway Pharyngeal- Mechanical Soft -- Pharyngeal -- Pharyngeal- Regular -- Pharyngeal -- Pharyngeal- Multi-consistency -- Pharyngeal -- Pharyngeal- Pill -- Pharyngeal -- Pharyngeal Comment --  CHL IP CERVICAL ESOPHAGEAL PHASE 02/22/2018 Cervical Esophageal Phase WFL Pudding Teaspoon -- Pudding Cup -- Honey Teaspoon -- Honey Cup -- Nectar Teaspoon -- Nectar Cup -- Nectar Straw -- Thin Teaspoon -- Thin Cup -- Thin Straw -- Puree -- Mechanical Soft -- Regular -- Multi-consistency -- Pill -- Cervical Esophageal Comment -- Deneise Lever, MS, CCC-SLP Speech-Language Pathologist [phone] No flowsheet data found. Aliene Altes 02/22/2018, 3:40 PM               Cardiac Studies:  Assessment/Plan:  New-onset A. Fib with RVR Status postSymptomatic sinus bradycardia with pauses/sinus arrest Tachybradycardia syndrome Sick sinus syndrome Chronic left bundle branch block Acute right para pontine CVA Hypertension Diabetes mellitus CLL Early dementia Thrombocytopenia Hypothyroidism Dysphagia Possible right community-acquired  pneumonia Plan Continue Coumadin per pharmacy Consider DC aspirin as patient high-risk of bleeding Consider adding low-dose Lopressor 12.5 mg twice daily, patient very high risk of having high-grade AV block and pauses   LOS: 3 days    Charolette Forward 02/24/2018, 9:30 AM

## 2018-02-24 NOTE — Consult Note (Signed)
Consultation Note Date: 02/24/2018   Patient Name: Martha Walters  DOB: December 17, 1928  MRN: [id number]  Age / Sex: 84 y.o., female  PCP: Emelia Loron Referring Physician: Zenia Resides, MD  Reason for Consultation: Establishing goals of care and Psychosocial/spiritual support  HPI/Patient Profile: 84 y.o. female  admitted on 02/21/2018 with AMS. 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  Patient was last seen at bedtime the night prior to admission in the morning was found to have global weakness and left facial droop, dysarthria and difficulty swallowing.  Transported to ER via EMS, code stroke called, CT of the head significant for atrophy and small vessel disease. MRI without contrast significant for small area of acute brainstem infarct.  At baseline patient is nonambulatory and dependent for most ADLs.  Her daughter speaks to a "good quality" of life as the patient enjoys spending time at home with her family.  Patient stabilized is taking orals safely, she is ready for discharge.  Family face advance directive decisions, treatment option decisions, and anticipatory care needs.    Clinical Assessment and Goals of Care:  This NP Wadie Lessen reviewed medical records, received report from team, assessed the patient and then meet at the patient's bedside along with her daughter/Rose and daughter/Vickie   to discuss diagnosis, prognosis, GOC, EOL wishes disposition and options.  Concept of Hospice and Palliative Care were discussed  We discussed the concept of failure to thrive secondary to multiple comorbidities.  We discussed the limitations of medical interventions and the possibilities for this patient to return to baseline.  Concept of mortality was discussed  A detailed discussion was had today regarding advanced directives.  Concepts specific  to code status, artifical feeding and hydration, continued IV antibiotics and rehospitalization was had.  The difference between a aggressive medical intervention path  and a palliative comfort care path for this patient at this time was had.  Values and goals of care important to patient and family were attempted to be elicited.  MOST form introduced    Questions and concerns addressed.   Family encouraged to call with questions or concerns.    PMT will continue to support holistically.  NEXT OF KIN    SUMMARY OF RECOMMENDATIONS    Code Status/Advance Care Planning:  DNR    Symptom Management:   Dysphagia; Diet as recommended by speech, high risk for aspiration and aspiration precautions  Palliative Prophylaxis:   Aspiration, Bowel Regimen, Delirium Protocol, Frequent Pain Assessment and Oral Care  Additional Recommendations (Limitations, Scope, Preferences):  Full Scope Treatment  Psycho-social/Spiritual:   Desire for further Chaplaincy support:no  Additional Recommendations: Education on Hospice  Prognosis:   Unable to determine  Discharge Planning:  Long discussion had today regarding of care in skilled nursing facilities as it relates to artificial hydration specifically.  Family is questioning continuation of IV fluids in a skilled nursing facility why the patient is attempting rehab.  We discussed that this is not typical and that  the patient is encouraged to eat and drink orals as she tolerates.  Family would consider PEG placement but we discussed that that is not a consideration at this time.    To Be Determined      Primary Diagnoses: Present on Admission: . Altered mental status   I have reviewed the medical record, interviewed the patient and family, and examined the patient. The following aspects are pertinent.  Past Medical History:  Diagnosis Date  . Bruises easily   . Clotting disorder ([place])   . Diabetes mellitus without complication ([place])    . Hypertension   . Thyroid disease    Social History   Socioeconomic History  . Marital status: Married    Spouse name: None  . Number of children: None  . Years of education: None  . Highest education level: None  Social Needs  . Financial resource strain: None  . Food insecurity - worry: None  . Food insecurity - inability: None  . Transportation needs - medical: None  . Transportation needs - non-medical: None  Occupational History  . None  Tobacco Use  . Smoking status: Never Smoker  . Smokeless tobacco: Never Used  Substance and Sexual Activity  . Alcohol use: No  . Drug use: No  . Sexual activity: None  Other Topics Concern  . None  Social History Narrative  . None   History reviewed. No pertinent family history. Scheduled Meds: . allopurinol  100 mg Oral BH-q7a  . amoxicillin-clavulanate  1 tablet Oral BID WC  . azithromycin  500 mg Oral q1800  . citalopram  10 mg Oral QPM  . insulin aspart  0-5 Units Subcutaneous QHS  . insulin aspart  0-9 Units Subcutaneous TID WC  . levothyroxine  200 mcg Oral QAC breakfast  . pantoprazole  40 mg Oral Daily  . Warfarin - Pharmacist Dosing Inpatient   Does not apply q1800   Continuous Infusions: . sodium chloride 500 mL (02/24/18 0355)   PRN Meds:.acetaminophen **OR** acetaminophen (TYLENOL) oral liquid 160 mg/5 mL **OR** acetaminophen, ondansetron, RESOURCE THICKENUP CLEAR Medications Prior to Admission:  Prior to Admission medications   Medication Sig Start Date End Date Taking? Authorizing Provider  allopurinol (ZYLOPRIM) 100 MG tablet Take 100 mg by mouth every morning.  03/26/14  Yes [provider]  Calcium Carbonate-Vitamin D (CALTRATE 600+D PO) Take 1 tablet by mouth daily.   Yes [provider]  citalopram (CELEXA) 10 MG tablet Take 10 mg by mouth every evening.  06/18/14  Yes [provider]  glipiZIDE (GLUCOTROL XL) 5 MG 24 hr tablet Take 5 mg by mouth daily with breakfast.   Yes  [provider]  insulin glargine (LANTUS) 100 UNIT/ML injection Inject 0.18 mLs (18 Units total) into the skin at bedtime. Patient taking differently: Inject 28 Units into the skin every morning.  10/03/16  Yes Rai, Ripudeep K, MD  levothyroxine (SYNTHROID, LEVOTHROID) 200 MCG tablet Take 200 mcg by mouth daily before breakfast.   Yes [provider]  Multiple Vitamins-Minerals (CENTRUM SILVER PO) Take 1 tablet by mouth daily.   Yes [provider]  omega-3 acid ethyl esters (LOVAZA) 1 G capsule Take 1 g by mouth 2 (two) times daily.  07/07/15  Yes [provider]  OVER THE COUNTER MEDICATION Take 1 capsule by mouth daily. "Sesame Seed Oil" capsules   Yes [provider]  ranitidine (ZANTAC) 150 MG tablet Take 150 mg by mouth daily.   Yes [provider]  Red Yeast Rice 600 MG CAPS Take 600 mg by mouth daily.    Yes [provider]  warfarin (COUMADIN) 10 MG tablet Take 10 mg by mouth daily.   Yes [provider]  amoxicillin (AMOXIL) 500 MG capsule Take 1 capsule (500 mg total) by mouth every 12 (twelve) hours. X 10days Patient not taking: Reported on 02/21/2018 10/03/16   Rai, Vernelle Emerald, MD  famotidine (PEPCID) 10 MG tablet Take 1 tablet (10 mg total) by mouth 2 (two) times daily. Patient not taking: Reported on 02/21/2018 10/03/16   Rai, Vernelle Emerald, MD  insulin aspart (NOVOLOG) 100 UNIT/ML injection Inject 0-9 Units into the skin 3 (three) times daily with meals. Sliding scale CBG 70 - 120: 0 units CBG 121 - 150: 1 unit,  CBG 151 - 200: 2 units,  CBG 201 - 250: 3 units,  CBG 251 - 300: 5 units,  CBG 301 - 350: 7 units,  CBG 351 - 400: 9 units   CBG > 400: 9 units and notify your MD Patient not taking: Reported on 02/21/2018 10/03/16   Rai, Ripudeep K, MD  insulin aspart (NOVOLOG) 100 UNIT/ML injection Inject 5 Units into the skin 3 (three) times daily with meals. Patient not taking: Reported on 02/21/2018 10/03/16   Rai,  Vernelle Emerald, MD  LEUKERAN 2 MG tablet Take 2 mg by mouth See admin instructions. Take 2 mg by mouth on Monday, Wednesday, Friday and Sunday. 04/12/14   [provider]  metoprolol succinate (TOPROL-XL) 50 MG 24 hr tablet Take 1 tablet (50 mg total) by mouth at bedtime. Patient not taking: Reported on 02/21/2018 10/03/16   Rai, Vernelle Emerald, MD  nystatin (MYCOSTATIN/NYSTOP) powder Apply topically 3 (three) times daily. Patient not taking: Reported on 02/21/2018 10/03/16   Mendel Corning, MD   Allergies  Allergen Reactions  . Phenergan [Promethazine Hcl] Other (See Comments)    Over Sedation  . Tetanus Toxoid Swelling  . Avelox [Moxifloxacin] Rash  . Levaquin [Levofloxacin In D5w] Hives  . Levofloxacin Hives  . Xarelto [Rivaroxaban]    Review of Systems  Unable to perform ROS: Acuity of condition    Physical Exam  Constitutional: She appears well-developed.  Cardiovascular: Regular rhythm. Tachycardia present.  Pulmonary/Chest: Effort normal. She has decreased breath sounds.  Musculoskeletal:  Generalized weakness greater on right than left, overall muscle atrophy  Neurological: She is alert.  Skin: Skin is warm and dry.    Vital Signs: BP (!) 145/57 (BP Location: Right Arm)   Pulse (!) 112   Temp 98.4 F (36.9 C) (Oral)   Resp 20   Ht 5\' 3"  (1.6 m)   Wt 83.8 kg (184 lb 11.9 oz)   SpO2 94%   BMI 32.73 kg/m  Pain Assessment: Faces   Pain Score: Asleep   SpO2: SpO2: 94 % O2 Device:SpO2: 94 % O2 Flow Rate: .O2 Flow Rate (L/min): 2 L/min  IO: Intake/output summary:   Intake/Output Summary (Last 24 hours) at 02/24/2018 0948 Last data filed at 02/24/2018 0439 Gross per 24 hour  Intake 2756.67 ml  Output 350 ml  Net 2406.67 ml    LBM: Last BM Date: (PTA) Baseline Weight: Weight: 84.4 kg (186 lb 1.1 oz) Most recent weight: Weight: 83.8 kg (184 lb 11.9 oz)     Palliative Assessment/Data: 30 % at best   Discussed with resident with family medicine/ Dr  Criss Rosales  Time In: 1300 Time Out: 1415 Time Total: 75 min  Greater than 50%  of this time was spent counseling and coordinating care related to the above assessment and plan.  Signed by: Wadie Lessen, NP   Please contact Palliative Medicine Team phone at [phone] for questions and concerns.  For individual provider: See Shea Evans

## 2018-02-24 NOTE — Progress Notes (Signed)
ANTICOAGULATION CONSULT NOTE - Initial Consult  Pharmacy Consult for warfarin Indication: history of recurrent DVTs  Allergies  Allergen Reactions  . Phenergan [Promethazine Hcl] Other (See Comments)    Over Sedation  . Tetanus Toxoid Swelling  . Avelox [Moxifloxacin] Rash  . Levaquin [Levofloxacin In D5w] Hives  . Levofloxacin Hives  . Xarelto [Rivaroxaban]     Patient Measurements: Height: 5\' 3"  (160 cm) Weight: 184 lb 11.9 oz (83.8 kg) IBW/kg (Calculated) : 52.4  Vital Signs: Temp: 98.4 F (36.9 C) (03/19 0928) Temp Source: Oral (03/19 0928) BP: 145/57 (03/19 0928) Pulse Rate: 112 (03/19 0928)  Labs: Recent Labs    02/22/18 0712 02/23/18 0500 02/24/18 0629  HGB 10.9* 10.5* 11.2*  HCT 33.8* 33.0* 35.3*  PLT 54* 52* 47*  LABPROT  --  20.8* 20.5*  INR  --  1.81 1.77  CREATININE 0.83 0.93 0.96    Estimated Creatinine Clearance: 42.4 mL/min (by C-G formula based on SCr of 0.96 mg/dL).    Assessment: 28 yof on warfarin PTA for hx of recurrent DVTs - home regimen reported as 10 mg daily. Last dose was on 3/15 at 1900. INR on admission was 1.47.   MRI found small area of acute brainstem infarction affecting R paramedian pons. Okay per medical teams to resume warfarin. On concurrent azithromycin and augmentin, which can impact INR sensitivity. Hgb is 10.5, plts low at 52. No signs/symptoms of bleeding.  INR = 1.77 today  Goal of Therapy:  INR 2-3 Monitor platelets by anticoagulation protocol: Yes   Plan:  Repeat Warfarin 7.5 mg po x 1 tonight Will monitor daily INR and CBC Monitor for signs/symptoms of bleeding  Thank you Anette Guarneri, PharmD [phone] 02/24/2018,11:11 AM

## 2018-02-24 NOTE — Progress Notes (Signed)
Physical Therapy Treatment Patient Details Name: Martha Walters MRN: [id number] DOB: [birth year] Today's Date: 02/24/2018    History of Present Illness Pt is an 84 yo female admitted through the ED on 02/21/18 as a code stroke with slurred speech, difficulty transferring, L lateral lean and facial droop. 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 PMH significant for HTN, DM2, Thyroid disease.     PT Comments    Pt more alert with increased ability to follow commands however remains severely deconditioned and has limited voluntary movement. Pt did tolerated 2 trials of standing this date. Acute PT to con't to follow.   Follow Up Recommendations  SNF     Equipment Recommendations  None recommended by PT    Recommendations for Other Services       Precautions / Restrictions Precautions Precautions: Fall Restrictions Weight Bearing Restrictions: No    Mobility  Bed Mobility Overal bed mobility: Needs Assistance Bed Mobility: Supine to Sit;Sit to Supine     Supine to sit: Max assist;+2 for physical assistance Sit to supine: Max assist;+2 for physical assistance   General bed mobility comments: pt with no initiation of LEs to EOB, maxA x2 for trunk elevation and to bring LEs off EOB as well as to return to supine  Transfers Overall transfer level: Needs assistance Equipment used: (2 person lift with gait belt) Transfers: Sit to/from Stand Sit to Stand: Total assist;+2 physical assistance         General transfer comment: completed 2 attempts, pt dependent but able to stand x20 sec for linen change with max A at bilat knees to block to prevent buckling and at posterior hips to achieve hip extension  Ambulation/Gait             General Gait Details: non ambulatory   Stairs            Wheelchair Mobility    Modified Rankin (Stroke Patients Only) Modified Rankin (Stroke  Patients Only) Pre-Morbid Rankin Score: Severe disability Modified Rankin: Severe disability     Balance Overall balance assessment: Needs assistance Sitting-balance support: Bilateral upper extremity supported;Feet supported Sitting balance-Leahy Scale: Zero Sitting balance - Comments: pt requries assistance from clinicians to maintain upright sitting EOB and leans posteriorly and to the left Postural control: Left lateral lean;Posterior lean                                  Cognition Arousal/Alertness: Awake/alert Behavior During Therapy: Flat affect Overall Cognitive Status: History of cognitive impairments - at baseline Area of Impairment: Problem solving                             Problem Solving: Slow processing;Decreased initiation;Difficulty sequencing;Requires verbal cues;Requires tactile cues General Comments: baseline dementia but was able to follow commands this date however limited by generalized deconditioning      Exercises      General Comments General comments (skin integrity, edema, etc.): pt purwick not funcitoning properly and pt saturated in urine. pt dependent for hygiene      Pertinent Vitals/Pain Pain Assessment: No/denies pain    Home Living                      Prior Function  PT Goals (current goals can now be found in the care plan section) Progress towards PT goals: Progressing toward goals    Frequency    Min 3X/week      PT Plan Current plan remains appropriate    Co-evaluation              AM-PAC PT "6 Clicks" Daily Activity  Outcome Measure  Difficulty turning over in bed (including adjusting bedclothes, sheets and blankets)?: Unable Difficulty moving from lying on back to sitting on the side of the bed? : Unable Difficulty sitting down on and standing up from a chair with arms (e.g., wheelchair, bedside commode, etc,.)?: Unable Help needed moving to and from a bed to  chair (including a wheelchair)?: Total Help needed walking in hospital room?: Total Help needed climbing 3-5 steps with a railing? : Total 6 Click Score: 6    End of Session Equipment Utilized During Treatment: Gait belt;Oxygen Activity Tolerance: Patient limited by fatigue;Patient limited by lethargy Patient left: in bed;with call bell/phone within reach;with family/visitor present;with bed alarm set Nurse Communication: Mobility status PT Visit Diagnosis: Muscle weakness (generalized) (M62.81);Unsteadiness on feet (R26.81);Other symptoms and signs involving the nervous system (R29.898)     Time: 3845-3646 PT Time Calculation (min) (ACUTE ONLY): 21 min  Charges:  $Therapeutic Activity: 8-22 mins                    G Codes:       Kittie Plater, PT, DPT Pager #: [phone] Office #: [phone]    Combine 02/24/2018, 10:30 AM

## 2018-02-24 NOTE — NC FL2 (Signed)
[place] LEVEL OF CARE SCREENING TOOL     IDENTIFICATION  Patient Name: Martha Walters Needs Birthdate: [birth year] Sex: female Admission Date (Current Location): 02/21/2018  [facility] and [place] Number:  [employer] and Address:  [facility] [place] [place] [address], [place], [place] [address]      Provider Number: [id number]  Attending Physician Name and Address:  Zenia Resides, MD  Relative Name and Phone Number:       Current Level of Care: Hospital Recommended Level of Care: [place] Prior Approval Number:    Date Approved/Denied:   PASRR Number: [id number]  Discharge Plan: SNF    Current Diagnoses: Patient Active Problem List   Diagnosis Date Noted  . Cerebral thrombosis with cerebral infarction 02/22/2018  . Dementia without behavioral disturbance   . Dysphagia   . Community acquired pneumonia   . Altered mental status 02/21/2018  . Closed fracture of zygomaticomaxillary complex ([place]) 10/01/2016  . Facial hematoma 10/01/2016  . Confusion 10/01/2016  . SDH (subdural hematoma) ([place]) 09/30/2016  . DM2 (diabetes mellitus, type 2) ([place]) 09/30/2016  . HTN (hypertension) 09/30/2016  . Chronic lymphocytic leukemia ([place]) 12/26/2015  . Acquired trigger finger 06/21/2014    Orientation RESPIRATION BLADDER Height & Weight     Self, Place  O2 Incontinent, External catheter Weight: 184 lb 11.9 oz (83.8 kg) Height:  5\' 3"  (160 cm)  BEHAVIORAL SYMPTOMS/MOOD NEUROLOGICAL BOWEL NUTRITION STATUS      Incontinent Diet  AMBULATORY STATUS COMMUNICATION OF NEEDS Skin   Extensive Assist Verbally Normal                       Personal Care Assistance Level of Assistance  Bathing, Feeding, Dressing Bathing Assistance: Maximum assistance Feeding assistance: Maximum assistance Dressing Assistance: Maximum assistance     Functional Limitations Info  Sight, Hearing, Speech Sight Info: Adequate Hearing Info:  Adequate Speech Info: Impaired    SPECIAL CARE FACTORS FREQUENCY  PT (By licensed PT), OT (By licensed OT), Speech therapy     PT Frequency: 5x/wk OT Frequency: 5x/wk     Speech Therapy Frequency: 5x/wk      Contractures Contractures Info: Not present    Additional Factors Info  Code Status, Allergies, Psychotropic, Insulin Sliding Scale Code Status Info: DNR Allergies Info: Phenergan Promethazine Hcl, Tetanus Toxoid, Avelox Moxifloxacin, Levaquin Levofloxacin In D5w, Levofloxacin, Xarelto Rivaroxaban Psychotropic Info: Celexa 10mg  daily at night Insulin Sliding Scale Info: 0-9 units 3x/day with meals; 0-5 units daily at bedtime       Current Medications (02/24/2018):  This is the current hospital active medication list Current Facility-Administered Medications  Medication Dose Route Frequency Provider Last Rate Last Dose  . 0.45 % sodium chloride infusion   Intravenous Continuous Lovenia Kim, MD 100 mL/hr at 02/24/18 0355 500 mL at 02/24/18 0355  . acetaminophen (TYLENOL) tablet 650 mg  650 mg Oral Q4H PRN Sela Hilding, MD       Or  . acetaminophen (TYLENOL) solution 650 mg  650 mg Per Tube Q4H PRN Sela Hilding, MD       Or  . acetaminophen (TYLENOL) suppository 650 mg  650 mg Rectal Q4H PRN Sela Hilding, MD      . allopurinol (ZYLOPRIM) tablet 100 mg  100 mg Oral Marice Potter, MD   100 mg at 02/24/18 0807  . amoxicillin-clavulanate (AUGMENTIN) 500-125 MG per tablet 500 mg  1 tablet Oral BID WC Hensel, Jamal Collin,  MD   500 mg at 02/24/18 0807  . azithromycin (ZITHROMAX) tablet 500 mg  500 mg Oral q1800 Zenia Resides, MD   500 mg at 02/23/18 1721  . citalopram (CELEXA) tablet 10 mg  10 mg Oral QPM Sela Hilding, MD   10 mg at 02/23/18 1721  . insulin aspart (novoLOG) injection 0-5 Units  0-5 Units Subcutaneous QHS Sherene Sires, DO   2 Units at 02/23/18 2311  . insulin aspart (novoLOG) injection 0-9 Units  0-9 Units Subcutaneous TID  WC Bland, Scott, DO   2 Units at 02/24/18 0809  . levothyroxine (SYNTHROID, LEVOTHROID) tablet 200 mcg  200 mcg Oral QAC breakfast Sela Hilding, MD   200 mcg at 02/24/18 0807  . ondansetron (ZOFRAN) injection 4 mg  4 mg Intravenous Q6H PRN Sela Hilding, MD   4 mg at 02/23/18 2036  . pantoprazole (PROTONIX) EC tablet 40 mg  40 mg Oral Daily Bland, Scott, DO   40 mg at 02/24/18 0920  . RESOURCE THICKENUP CLEAR   Oral PRN Zenia Resides, MD      . Warfarin - Pharmacist Dosing Inpatient   Does not apply q1800 Hensel, Jamal Collin, MD         Discharge Medications: Please see discharge summary for a list of discharge medications.  Relevant Imaging Results:  Relevant Lab Results:   Additional Information SS#: [id number]  Geralynn Ochs, LCSW

## 2018-02-24 NOTE — Progress Notes (Signed)
Notified Dr. Criss Rosales of possible rectal fecal impaction, large amount of stool visible in rectum, client unable to push stool out. Water stool leaking around stool in rectum. Order to give second suppository received. Notified Dr. Criss Rosales that due to position and amount of stool in rectum, that stool is blocking ability to administer suppository.  Dr. Criss Rosales Replied, stating he would come to the unit and attempt to resolve the situation.

## 2018-02-24 NOTE — Progress Notes (Signed)
Family Medicine Teaching Service Daily Progress Note Intern Pager: [phone]  Patient name: Martha Walters Medical record number: [id number] Date of birth: 04-02-1929 Age: 84 y.o. Gender: female  Primary Care Provider: Egbert Garibaldi, PA-C Consultants: neurology Code Status: DNR, confirmed with children as patient is demented  Pt Overview and Major Events to Date:  3/16 admitted with new pons CVA  Assessment and Plan: [name] is a 84 y.o. female presenting with AMS. 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   Acute R paramedian pons CVA: presented with L facial droop, new dysarthia + trouble swallowing. MRI confirmed R paramedian pons infarct. Neurology following.   Per discussion with son and daughter, they would like to decline MRA and carotid US as they would not elect for surgical intervention if there were significant findings on these tests.  SLP recommends dys1, PT/OT recommend SNF.  Lipid panel mod elevated.  Family not electing for invasive interventions at this time   Echo: Left ventricle with mild   concentric hypertrophy. EF-55/60. Aortic valve w/trivial regurgitation.  - Mitral valve w/ Calcified annulus/ severely calcified leaflets/ mild stenosis/severe regurgitation.  Left atrium w/ mild dilation. Pulmonary arteries w/ Systolic pressure moderately increased. - admitted to tele, Dr. Ardelia Mems -neuro checks -stroke precautions -aspirin 300 (will discuss cancelling in light of expectency and GOC) -permissive HTN to 220systolic, may normalize ~2/69 -d/c aTORVASTATIN 20 at patient/family's request for complaints of joint pain -neuro to guide anticoagulation plan (warfarin currently stopped)  Vomiting overnight: patient vomited overnight when laid flat. Son states this is not new for her and her reflux.  -IV PPI as NPO -SLP eval as above   Community Acquired Pneumonia: Daughter reports history of recurrent pneumonias.  Concern  for possible aspiration as a source.  Speech eval as above.  Chest x-ray with findings suggestive for right infrahilar airspace disease.  Malignancy is also a concern at this time.  Will trial antibiotics for community-acquired pneumonia and recommend repeat of chest x-ray in 3-4 weeks to assess for possible underlying pneumonia. --augmentin (3/18- )   ----s/p CTX/azithromycin  CLL: Leukocytosis to 77 - atypical lympocytes + smudge cells on diff. Hx of known CLL, follows with . Concern for hypercoagulable state and subtherapeutic INR as a possible cause of potential CVA as above.  -monitor on daily CBC  -consider heme onc vs calling her [facility] hematologist   Thrombocytopenia: appears chronic per Care Everywhere chart review.  -monitor on CBC   Hx of recurrent DVTs: daughter report on warfarin for this. Dosed at home by pill box through PCP, daughter denies missing doses. Subtherapeutic INR.  -hold pending further CVA workup above  Hx of GI ulcer: -no indication of acute bleed currently, will monitor  Mild hyponatremia: new. Question decreased PO given new swallowing trouble and dementia.   -NS 100cc/hr x 12 hours  T2DM: on lantus 28U q am at home per daugther. Did not get this today. A1c 8.8. NPO, will hold home lantus. -sSSI   R lateral 5th toe wound: appears chronic, question poor perfusion. Does not appear infected.  -wound care consult  Hypothyroidism: hx of synthroid as home med.  TSH wnl -continue synthroid  Dementia- -home citalopram  Gout-  -Home allopurinol  FEN/GI: dys 1 Prophylaxis: SCDs  Disposition: to SNF when medically cleared by neuro  Subjective:  No pain complaints, has been feeling weak but looking forward to SNF, palliative entered to have [place] discussion as I left.   They  did ask to d/c statin.  Objective: Temp:  [97.5 F (36.4 C)-99.5 F (37.5 C)] 99.5 F (37.5 C) (03/19 0437) Pulse Rate:  [93-107] 107 (03/19 0437) Resp:  [20] 20 (03/19  0437) BP: (115-154)/(38-49) 115/45 (03/19 0437) SpO2:  [91 %-97 %] 91 % (03/19 0437) Physical Exam: General: frail elderly female in NAD Cardiovascular: RRR, 3/6 systolic murmur Respiratory: CTAB, easy WOB Abdomen: SNTND, +BS Extremities: no edema Neuro: persistent L facial droop, mild confusion and slurring of words unchanged from prior  Laboratory: Recent Labs  Lab 02/21/18 1105 02/21/18 1112 02/22/18 0712 02/23/18 0500  WBC 77.5*  --  47.2* 43.5*  HGB 11.9* 12.2 10.9* 10.5*  HCT 36.5 36.0 33.8* 33.0*  PLT 56*  --  54* 52*   Recent Labs  Lab 02/21/18 1105 02/21/18 1112 02/22/18 0712 02/23/18 0500  NA 134* 136 136 137  K 4.7 4.0 4.3 3.8  CL 101 99* 106 106  CO2 25  --  24 21*  BUN 12 12 12 13   CREATININE 0.79 0.70 0.83 0.93  CALCIUM 9.0  --  8.2* 7.8*  PROT 5.7*  --   --   --   BILITOT 0.8  --   --   --   ALKPHOS 91  --   --   --   ALT 26  --   --   --   AST 38  --   --   --   GLUCOSE 137* 132* 152* 160*    Imaging/Diagnostic Tests: Dg Chest 2 View  Result Date: 02/21/2018 CLINICAL DATA:  Chest pain EXAM: CHEST - 2 VIEW COMPARISON:  10/31/2017 FINDINGS: Lungs are under aerated. There is focal opacity in the right infrahilar region. No pneumothorax. No pleural effusion. IMPRESSION: Findings are worrisome for right infrahilar airspace disease. Followup PA and lateral chest X-ray is recommended in 3-4 weeks following trial of antibiotic therapy to ensure resolution and exclude underlying malignancy. Electronically Signed   By: Marybelle Killings M.D.   On: 02/21/2018 13:09   Mr Brain Wo Contrast  Result Date: 02/21/2018 CLINICAL DATA:  Code stroke, mildly altered, unable to transfer. EXAM: MRI HEAD WITHOUT CONTRAST TECHNIQUE: Multiplanar, multiecho pulse sequences of the brain and surrounding structures were obtained without intravenous contrast. COMPARISON:  Code stroke CT earlier today. FINDINGS: Brain: There is a small tubular area of restricted diffusion affecting the  RIGHT paramedian pons consistent with acute infarction. No other areas of involvement are seen. There is no hemorrhage, mass lesion, or extra-axial fluid. There is extensive atrophy with prominence of the ventricles, cisterns, and sulci. Hypoattenuation of white matter representing small vessel disease is observed. Vascular: Flow voids are maintained throughout the carotid, basilar, and vertebral arteries. There is a small focus of chronic hemorrhage in the RIGHT frontal lobe, either sequelae of old trauma or ischemia. Skull and upper cervical spine: Normal marrow signal. Sinuses/Orbits: Negative. Other: Correlating with the earlier CT, the infarct is not visible. IMPRESSION: Small area of acute brainstem infarction affecting the RIGHT paramedian pons, without associated large vessel occlusion. This infarction is consistent with a small vessel ischemic insult secondary to hypertension and/or diabetes. Advanced atrophy with chronic microvascular ischemic change elsewhere. No visible acute hemorrhage. Electronically Signed   By: Staci Righter M.D.   On: 02/21/2018 16:20   Dg Swallowing Func-speech Pathology  Result Date: 02/22/2018 Objective Swallowing Evaluation: Type of Study: MBS-Modified Barium Swallow Study  Patient Details Name: Pahola Dimmitt MRN: [id number] Date of Birth: [birth year] Today's Date: 02/22/2018 Time:  SLP Start Time (ACUTE ONLY): 2440 -SLP Stop Time (ACUTE ONLY): 1505 SLP Time Calculation (min) (ACUTE ONLY): 20 min Past Medical History: Past Medical History: Diagnosis Date . Bruises easily  . Clotting disorder ([place])  . Diabetes mellitus without complication ([place])  . Hypertension  . Thyroid disease  Past Surgical History: Past Surgical History: Procedure Laterality Date . trigger finger injection   HPI: Aurea Aronov a 84 y.o.femalepresenting with AMS. 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 Per MD pt has had PNA recently.  Subjective: Pt arrives to [place] with son and daughter Assessment / Plan / Recommendation CHL IP CLINICAL IMPRESSIONS 02/22/2018 Clinical Impression Patient presents with severe oropharyngeal dysphagia due to sensory, motor and cognitive impairments. Oral stage is characterized by prolonged oral holding, decreased oral manipulation leading to reduced bolus cohesion and delayed oral transit, premature spillage. Swallow initiation delayed, with prolonged pooling in the valleculae (puree) and pyriforms (honey, thin liquids). Pharyngeal stage characterized by mildly decreased base of tongue retraction, decreased hyolaryngeal excursion with delayed, incomplete closure of the laryngeal vestibule. There is penetration before the swallow with thin liquids, with eventual sensed aspiration, and penetration/aspiration during the swallow with honey-thick liquids due to spillage into the airway from liquid pooled in the pyriforms. Pt did sense aspiration, and SLP was able to remove some material via oral suction, though some barium did remain in the trachea. There was mild, shallow penetration with puree which was transient. Pt easily distracted during testing by anterior spillage and environmental commotion, requiring frequent verbal and tactile cues to refocus and initiate swallow. Son and daughter reviewed the study in real time and SLP explained findings, recommendations and rationale after completion of the exam. Recommend dys 1, pudding thick liquids by teaspoon, with full supervision and assistance, meds crushed. Feeding will be slow and effortful, and I anticipate pt will require significant cuing. Educated son and daughter in strategies including reducing environmental distractions, monitoring for swallow initiation, and cuing techniques. Will follow for tolerance, interventions to improve swallow function, caregiver education,  consider repeat instrumental assessment if improvements at bedside. SLP Visit Diagnosis Dysphagia, oral phase (R13.11);Dysphagia, oropharyngeal phase (R13.12);Dysphagia, pharyngeal phase (R13.13) Attention and concentration deficit following -- Frontal lobe and executive function deficit following -- Impact on safety and function Severe aspiration risk   CHL IP TREATMENT RECOMMENDATION 02/22/2018 Treatment Recommendations Therapy as outlined in treatment plan below   Prognosis 02/22/2018 Prognosis for Safe Diet Advancement Fair Barriers to Reach Goals Cognitive deficits;Severity of deficits Barriers/Prognosis Comment -- CHL IP DIET RECOMMENDATION 02/22/2018 SLP Diet Recommendations Dysphagia 1 (Puree) solids;Pudding thick liquid Liquid Administration via Spoon Medication Administration Crushed with puree Compensations Slow rate;Small sips/bites;Minimize environmental distractions Postural Changes Seated upright at 90 degrees   CHL IP OTHER RECOMMENDATIONS 02/22/2018 Recommended Consults -- Oral Care Recommendations Oral care BID;Oral care before and after PO Other Recommendations Order thickener from pharmacy;Prohibited food (jello, ice cream, thin soups);Remove water pitcher;Have oral suction available   CHL IP FOLLOW UP RECOMMENDATIONS 02/22/2018 Follow up Recommendations Skilled Nursing facility   [facility] IP FREQUENCY AND DURATION 02/22/2018 Speech Therapy Frequency (ACUTE ONLY) min 3x week Treatment Duration 2 weeks      CHL IP ORAL PHASE 02/22/2018 Oral Phase Impaired Oral - Pudding Teaspoon -- Oral - Pudding Cup -- Oral - Honey Teaspoon Left anterior bolus loss;Right anterior bolus loss;Reduced posterior propulsion;Holding of bolus;Delayed oral transit;Decreased bolus cohesion;Premature  spillage Oral - Honey Cup Reduced posterior propulsion;Holding of bolus;Delayed oral transit;Decreased bolus cohesion;Premature spillage Oral - Nectar Teaspoon -- Oral - Nectar Cup -- Oral - Nectar Straw -- Oral - Thin Teaspoon Left  anterior bolus loss;Right anterior bolus loss Oral - Thin Cup Left anterior bolus loss;Right anterior bolus loss;Reduced posterior propulsion;Holding of bolus;Delayed oral transit;Decreased bolus cohesion;Premature spillage Oral - Thin Straw -- Oral - Puree Reduced posterior propulsion;Holding of bolus;Delayed oral transit;Decreased bolus cohesion Oral - Mech Soft -- Oral - Regular -- Oral - Multi-Consistency -- Oral - Pill -- Oral Phase - Comment --  CHL IP PHARYNGEAL PHASE 02/22/2018 Pharyngeal Phase Impaired Pharyngeal- Pudding Teaspoon -- Pharyngeal -- Pharyngeal- Pudding Cup -- Pharyngeal -- Pharyngeal- Honey Teaspoon Reduced epiglottic inversion;Reduced anterior laryngeal mobility;Reduced laryngeal elevation;Reduced airway/laryngeal closure;Reduced tongue base retraction;Penetration/Aspiration during swallow;Pharyngeal residue - valleculae Pharyngeal Material enters airway, remains ABOVE vocal cords and not ejected out Pharyngeal- Honey Cup Reduced epiglottic inversion;Reduced anterior laryngeal mobility;Reduced laryngeal elevation;Reduced airway/laryngeal closure;Reduced tongue base retraction;Penetration/Aspiration during swallow;Trace aspiration;Pharyngeal residue - valleculae Pharyngeal Material enters airway, passes BELOW cords and not ejected out despite cough attempt by patient Pharyngeal- Nectar Teaspoon -- Pharyngeal -- Pharyngeal- Nectar Cup -- Pharyngeal -- Pharyngeal- Nectar Straw -- Pharyngeal -- Pharyngeal- Thin Teaspoon -- Pharyngeal -- Pharyngeal- Thin Cup Reduced epiglottic inversion;Reduced anterior laryngeal mobility;Reduced laryngeal elevation;Reduced airway/laryngeal closure;Reduced tongue base retraction;Penetration/Aspiration before swallow;Penetration/Aspiration during swallow;Moderate aspiration;Pharyngeal residue - valleculae;Pharyngeal residue - pyriform Pharyngeal Material enters airway, passes BELOW cords and not ejected out despite cough attempt by patient Pharyngeal- Thin Straw --  Pharyngeal -- Pharyngeal- Puree Reduced epiglottic inversion;Reduced anterior laryngeal mobility;Reduced laryngeal elevation;Reduced airway/laryngeal closure;Reduced tongue base retraction;Penetration/Aspiration during swallow;Pharyngeal residue - valleculae Pharyngeal Material enters airway, remains ABOVE vocal cords then ejected out;Material does not enter airway Pharyngeal- Mechanical Soft -- Pharyngeal -- Pharyngeal- Regular -- Pharyngeal -- Pharyngeal- Multi-consistency -- Pharyngeal -- Pharyngeal- Pill -- Pharyngeal -- Pharyngeal Comment --  CHL IP CERVICAL ESOPHAGEAL PHASE 02/22/2018 Cervical Esophageal Phase WFL Pudding Teaspoon -- Pudding Cup -- Honey Teaspoon -- Honey Cup -- Nectar Teaspoon -- Nectar Cup -- Nectar Straw -- Thin Teaspoon -- Thin Cup -- Thin Straw -- Puree -- Mechanical Soft -- Regular -- Multi-consistency -- Pill -- Cervical Esophageal Comment -- Deneise Lever, MS, CCC-SLP Speech-Language Pathologist [phone] No flowsheet data found. Aliene Altes 02/22/2018, 3:40 PM              Ct Head Code Stroke Wo Contrast  Result Date: 02/21/2018 CLINICAL DATA:  Code stroke. LEFT facial droop with slurred speech. Generalized weakness. Altered mental status. EXAM: CT HEAD WITHOUT CONTRAST TECHNIQUE: Contiguous axial images were obtained from the base of the skull through the vertex without intravenous contrast. COMPARISON:  CT head 09/30/2016. FINDINGS: Brain: No evidence for acute infarction, hemorrhage, mass lesion, hydrocephalus, or extra-axial fluid. Advanced atrophy. Chronic microvascular ischemic change. Vascular: Calcification of the cavernous internal carotid arteries and distal vertebral arteries consistent with cerebrovascular atherosclerotic disease. No signs of intracranial large vessel occlusion. Skull: Normal. Negative for fracture or focal lesion. Sinuses/Orbits: No acute finding. Other: Compared with priors, the RIGHT frontal parenchymal hemorrhage has resolved. ASPECTS [facility]  Stroke Program Early CT Score) - Ganglionic level infarction (caudate, lentiform nuclei, internal capsule, insula, M1-M3 cortex): 7 - Supraganglionic infarction (M4-M6 cortex): 3 Total score (0-10 with 10 being normal): 10 IMPRESSION: 1. Atrophy and small vessel disease. No acute intracranial findings. 2. ASPECTS is 10. 3. These results were communicated to Dr. Cheral Marker at 11:03 amon 3/16/2019by text page via the  [employer] messaging system. Electronically Signed   By: Staci Righter M.D.   On: 02/21/2018 11:05     Sherene Sires, DO 02/24/2018, 6:16 AM PGY-1, [place] Intern pager: [phone], text pages welcome

## 2018-02-24 NOTE — Progress Notes (Signed)
INTERIM PROGRESS NOTE  Reviewed ECG and discussed cardiology recommendations at rounds.  Have called across and left message with Dr. Zenia Resides office (he is in cath) for clarification as we could not identify Afib on ECG and it is possible he identified it on tele.  Will hold lopressor pending confirmation of plan from Cardiology.    We have stopped aspirin at his recommendation.  -Dr. Criss Rosales

## 2018-02-24 NOTE — Progress Notes (Signed)
CSW following for discharge plan. CSW alerted by RN that patient's family would like [facility] in [place] for SNF. CSW completed referral and sent to facility for review. CSW contacted Admissions; they will not have a bed available until Friday, at the earliest. CSW attempted to meet with patient's daughter to complete assessment and inform her of [facility] not having a bed. Patient's daughter discussed how she didn't want [facility] anymore because they couldn't do an IV, and that she would like CSW to come back later this afternoon after palliative meeting to discuss plans with both daughters present.  CSW will follow up this afternoon.  Laveda Abbe, [place] Clinical Social Worker [phone]

## 2018-02-25 DIAGNOSIS — Z66 Do not resuscitate: Secondary | ICD-10-CM

## 2018-02-25 DIAGNOSIS — R531 Weakness: Secondary | ICD-10-CM

## 2018-02-25 DIAGNOSIS — R1314 Dysphagia, pharyngoesophageal phase: Secondary | ICD-10-CM

## 2018-02-25 DIAGNOSIS — Z515 Encounter for palliative care: Secondary | ICD-10-CM

## 2018-02-25 LAB — GLUCOSE, CAPILLARY
GLUCOSE-CAPILLARY: 187 mg/dL — AB (ref 65–99)
GLUCOSE-CAPILLARY: 237 mg/dL — AB (ref 65–99)
Glucose-Capillary: 173 mg/dL — ABNORMAL HIGH (ref 65–99)
Glucose-Capillary: 274 mg/dL — ABNORMAL HIGH (ref 65–99)

## 2018-02-25 LAB — CBC
HEMATOCRIT: 33.7 % — AB (ref 36.0–46.0)
HEMOGLOBIN: 10.8 g/dL — AB (ref 12.0–15.0)
MCH: 30.1 pg (ref 26.0–34.0)
MCHC: 32 g/dL (ref 30.0–36.0)
MCV: 93.9 fL (ref 78.0–100.0)
Platelets: 47 10*3/uL — ABNORMAL LOW (ref 150–400)
RBC: 3.59 MIL/uL — ABNORMAL LOW (ref 3.87–5.11)
RDW: 16.8 % — ABNORMAL HIGH (ref 11.5–15.5)
WBC: 52.8 10*3/uL — AB (ref 4.0–10.5)

## 2018-02-25 LAB — BASIC METABOLIC PANEL
ANION GAP: 7 (ref 5–15)
BUN: 14 mg/dL (ref 6–20)
CO2: 22 mmol/L (ref 22–32)
Calcium: 7.8 mg/dL — ABNORMAL LOW (ref 8.9–10.3)
Chloride: 106 mmol/L (ref 101–111)
Creatinine, Ser: 0.88 mg/dL (ref 0.44–1.00)
GFR calc non Af Amer: 57 mL/min — ABNORMAL LOW (ref >60)
GLUCOSE: 178 mg/dL — AB (ref 65–99)
Potassium: 4.3 mmol/L (ref 3.5–5.1)
Sodium: 135 mmol/L (ref 135–145)

## 2018-02-25 LAB — PROTIME-INR
INR: 2.18
Prothrombin Time: 24.1 seconds — ABNORMAL HIGH (ref 11.4–15.2)

## 2018-02-25 LAB — MAGNESIUM: MAGNESIUM: 1.8 mg/dL (ref 1.7–2.4)

## 2018-02-25 MED ORDER — WARFARIN SODIUM 7.5 MG PO TABS
7.5000 mg | ORAL_TABLET | Freq: Once | ORAL | Status: AC
  Administered 2018-02-25: 7.5 mg via ORAL
  Filled 2018-02-25: qty 1

## 2018-02-25 MED ORDER — SODIUM CHLORIDE 0.9 % IV SOLN
1.0000 g | Freq: Once | INTRAVENOUS | Status: AC
  Administered 2018-02-25: 1 g via INTRAVENOUS
  Filled 2018-02-25: qty 10

## 2018-02-25 MED ORDER — METOPROLOL TARTRATE 12.5 MG HALF TABLET
12.5000 mg | ORAL_TABLET | Freq: Two times a day (BID) | ORAL | Status: DC
  Administered 2018-02-25 – 2018-02-27 (×5): 12.5 mg via ORAL
  Filled 2018-02-25 (×5): qty 1

## 2018-02-25 MED ORDER — PANTOPRAZOLE SODIUM 40 MG PO PACK
40.0000 mg | PACK | Freq: Every day | ORAL | Status: DC
  Administered 2018-02-25 – 2018-02-27 (×4): 40 mg via ORAL
  Filled 2018-02-25 (×3): qty 20

## 2018-02-25 MED ORDER — ORAL CARE MOUTH RINSE
15.0000 mL | Freq: Two times a day (BID) | OROMUCOSAL | Status: DC
  Administered 2018-02-25 – 2018-02-26 (×4): 15 mL via OROMUCOSAL

## 2018-02-25 NOTE — Progress Notes (Signed)
[facility] for warfarin Indication: history of recurrent DVTs  Allergies  Allergen Reactions  . Phenergan [Promethazine Hcl] Other (See Comments)    Over Sedation  . Tetanus Toxoid Swelling  . Avelox [Moxifloxacin] Rash  . Levaquin [Levofloxacin In D5w] Hives  . Levofloxacin Hives  . Xarelto [Rivaroxaban]     Patient Measurements: Height: 5\' 3"  (160 cm) Weight: 184 lb 11.9 oz (83.8 kg) IBW/kg (Calculated) : 52.4  Vital Signs: Temp: 99.1 F (37.3 C) (03/20 0823) Temp Source: Oral (03/20 0823) BP: 160/67 (03/20 0823) Pulse Rate: 115 (03/20 0823)  Labs: Recent Labs    02/23/18 0500 02/24/18 0629 02/25/18 0538  HGB 10.5* 11.2* 10.8*  HCT 33.0* 35.3* 33.7*  PLT 52* 47* 47*  LABPROT 20.8* 20.5* 24.1*  INR 1.81 1.77 2.18  CREATININE 0.93 0.96 0.88    Estimated Creatinine Clearance: 46.2 mL/min (by C-G formula based on SCr of 0.88 mg/dL).    Assessment: 76 yof on warfarin PTA for hx of recurrent DVTs - home regimen reported as 10 mg daily. Last dose was on 3/15 at 1900. INR on admission was 1.47.   MRI found small area of acute brainstem infarction affecting R paramedian pons. Okay per medical teams to resume warfarin. On concurrent azithromycin and augmentin, which can impact INR sensitivity.   INR = 2.18 today  Goal of Therapy:  INR 2-3 Monitor platelets by anticoagulation protocol: Yes   Plan:  Repeat Warfarin 7.5 mg po x 1 tonight Will monitor daily INR and CBC Monitor for signs/symptoms of bleeding  Thank you Anette Guarneri, PharmD [phone] 02/25/2018,9:57 AM

## 2018-02-25 NOTE — Progress Notes (Signed)
Family Medicine Teaching Service Daily Progress Note Intern Pager: [phone]  Patient name: Martha Walters Medical record number: [id number] Date of birth: Aug 21, 1929 Age: 84 y.o. Gender: female  Primary Care Provider: Egbert Garibaldi, PA-C Consultants: neurology Code Status: DNR, confirmed with children as patient is demented  Pt Overview and Major Events to Date:  3/16 admitted with new pons CVA  Assessment and Plan: Martha Walters is a 84 y.o. female presenting with AMS. 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   Acute R paramedian pons CVA: presented with L facial droop, new dysarthia + trouble swallowing. MRI confirmed R paramedian pons infarct. Neurology following.   Per discussion with son and daughter, they would like to decline MRA and carotid US as they would not elect for surgical intervention if there were significant findings on these tests.  SLP recommends dys1, PT/OT recommend SNF.  Lipid panel mod elevated.  Family not electing for invasive interventions at this time   Echo: Left ventricle with mild concentric hypertrophy. EF-55/60. Aortic valve w/trivial regurgitation.  - Mitral valve w/ Calcified annulus/ severely calcified leaflets/ mild stenosis/severe regurgitation.  Left atrium w/ mild dilation. Pulmonary arteries w/ Systolic pressure moderately increased.  No statin at request of family, risks were shared. -neuro checks  -stroke precautions -d/c'd aspirin -will end permissive HTN 3/23 -warfarin per pharm  New afib w/o RVR: confirmed by cardiology on monitor although not showing on ECG.   Rates largely <115.   With concern for sinus pauses the goal from cardiology is not to aggressively chase rate control.    -if rates sustain >120 will add low dose 12.5mg  metoprolol BID  Hypocalcemia: 7.8 corrected to 8.2 -ordered 1GM calcium gluconate -mag lab add-on  Vomiting overnight: resolved if not reclined flat   Community  Acquired Pneumonia: Daughter reports history of recurrent pneumonias.  Concern for possible aspiration as a source.  Speech eval as above.  Chest x-ray with findings suggestive for right infrahilar airspace disease.  Malignancy is also a concern at this time.  Will trial antibiotics for community-acquired pneumonia and recommend repeat of chest x-ray in 3-4 weeks to assess for possible underlying pneumonia. --augmentin (3/18- )   ----s/p CTX/azithromycin  CLL: Leukocytosis to 77 - atypical lympocytes + smudge cells on diff. Hx of known CLL, follows with . Concern for hypercoagulable state and subtherapeutic INR as a possible cause of potential CVA as above.  -monitor on daily CBC  -consider heme onc vs calling her [facility] hematologist   Thrombocytopenia: appears chronic per Care Everywhere chart review.  -monitor on CBC   Hx of recurrent DVTs: daughter report on warfarin for this. Dosed at home by pill box through PCP, daughter denies missing doses. Subtherapeutic INR.  -hold pending further CVA workup above  Hx of GI ulcer: -no indication of acute bleed currently, will monitor  Mild hyponatremia: new. Question decreased PO given new swallowing trouble and dementia.   -NS 100cc/hr x 12 hours  T2DM: on lantus 28U q am at home per daugther. A1c 8.8. NPO, will hold home lantus. -sSSI   Swelling in hands: could not take rings off due to swelling.  No immediate concern for circulation -elevated hands and will try with vaseline later in day.  R lateral 5th toe wound: appears chronic, question poor perfusion. Does not appear infected.  -wound care consult  Hypothyroidism: hx of synthroid as home med.  TSH wnl -continue synthroid  Dementia- -home citalopram  Gout-  -Home  allopurinol  FEN/GI: dys 1 Prophylaxis: SCDs  Disposition: awaiting SNF placement  Subjective:  Patient claimed some coughing during eating, we asked her to wait for SLP to eat again as they were scheduled to  see her for her next meal.  No other complaints, no new neuro effects  Objective: Temp:  [97.5 F (36.4 C)-98.4 F (36.9 C)] 98.2 F (36.8 C) (03/20 0030) Pulse Rate:  [100-112] 107 (03/20 0030) Resp:  [16-22] 20 (03/20 0030) BP: (121-151)/(52-68) 135/60 (03/20 0030) SpO2:  [94 %-98 %] 98 % (03/20 0030) Physical Exam: General: frail elderly female in NAD  Cardiovascular: RRR, 3/6 systolic murmur Respiratory: CTAB, easy WOB Abdomen: SNTND, +BS Extremities: some puffiness in hands, rings were a little tight but with distal sensation/perfusion intact Neuro: persistent L facial droop, mild confusion and slurring of words unchanged from prior, sensation intact bilaterally  Laboratory: Recent Labs  Lab 02/22/18 0712 02/23/18 0500 02/24/18 0629  WBC 47.2* 43.5* 42.3*  HGB 10.9* 10.5* 11.2*  HCT 33.8* 33.0* 35.3*  PLT 54* 52* 47*   Recent Labs  Lab 02/21/18 1105  02/22/18 0712 02/23/18 0500 02/24/18 0629  NA 134*   < > 136 137 135  K 4.7   < > 4.3 3.8 4.0  CL 101   < > 106 106 105  CO2 25  --  24 21* 19*  BUN 12   < > 12 13 14   CREATININE 0.79   < > 0.83 0.93 0.96  CALCIUM 9.0  --  8.2* 7.8* 7.8*  PROT 5.7*  --   --   --   --   BILITOT 0.8  --   --   --   --   ALKPHOS 91  --   --   --   --   ALT 26  --   --   --   --   AST 38  --   --   --   --   GLUCOSE 137*   < > 152* 160* 190*   < > = values in this interval not displayed.    Imaging/Diagnostic Tests: Dg Chest 2 View  Result Date: 02/21/2018 CLINICAL DATA:  Chest pain EXAM: CHEST - 2 VIEW COMPARISON:  10/31/2017 FINDINGS: Lungs are under aerated. There is focal opacity in the right infrahilar region. No pneumothorax. No pleural effusion. IMPRESSION: Findings are worrisome for right infrahilar airspace disease. Followup PA and lateral chest X-ray is recommended in 3-4 weeks following trial of antibiotic therapy to ensure resolution and exclude underlying malignancy. Electronically Signed   By: Marybelle Killings M.D.   On:  02/21/2018 13:09   Mr Brain Wo Contrast  Result Date: 02/21/2018 CLINICAL DATA:  Code stroke, mildly altered, unable to transfer. EXAM: MRI HEAD WITHOUT CONTRAST TECHNIQUE: Multiplanar, multiecho pulse sequences of the brain and surrounding structures were obtained without intravenous contrast. COMPARISON:  Code stroke CT earlier today. FINDINGS: Brain: There is a small tubular area of restricted diffusion affecting the RIGHT paramedian pons consistent with acute infarction. No other areas of involvement are seen. There is no hemorrhage, mass lesion, or extra-axial fluid. There is extensive atrophy with prominence of the ventricles, cisterns, and sulci. Hypoattenuation of white matter representing small vessel disease is observed. Vascular: Flow voids are maintained throughout the carotid, basilar, and vertebral arteries. There is a small focus of chronic hemorrhage in the RIGHT frontal lobe, either sequelae of old trauma or ischemia. Skull and upper cervical spine: Normal marrow signal. Sinuses/Orbits: Negative. Other:  Correlating with the earlier CT, the infarct is not visible. IMPRESSION: Small area of acute brainstem infarction affecting the RIGHT paramedian pons, without associated large vessel occlusion. This infarction is consistent with a small vessel ischemic insult secondary to hypertension and/or diabetes. Advanced atrophy with chronic microvascular ischemic change elsewhere. No visible acute hemorrhage. Electronically Signed   By: Staci Righter M.D.   On: 02/21/2018 16:20   Dg Swallowing Func-speech Pathology  Result Date: 02/22/2018 Objective Swallowing Evaluation: Type of Study: MBS-Modified Barium Swallow Study  Patient Details Name: Martha Walters MRN: [id number] Date of Birth: [birth year] Today's Date: 02/22/2018 Time: SLP Start Time (ACUTE ONLY): 3299 -SLP Stop Time (ACUTE ONLY): 1505 SLP Time Calculation (min) (ACUTE ONLY): 20 min Past Medical History: Past Medical History: Diagnosis Date .  Bruises easily  . Clotting disorder ([place])  . Diabetes mellitus without complication ([place])  . Hypertension  . Thyroid disease  Past Surgical History: Past Surgical History: Procedure Laterality Date . trigger finger injection   HPI: Shyleigh Daughtry a 84 y.o.femalepresenting with AMS. 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 Per MD pt has had PNA recently.  Subjective: Pt arrives to [place] with son and daughter Assessment / Plan / Recommendation CHL IP CLINICAL IMPRESSIONS 02/22/2018 Clinical Impression Patient presents with severe oropharyngeal dysphagia due to sensory, motor and cognitive impairments. Oral stage is characterized by prolonged oral holding, decreased oral manipulation leading to reduced bolus cohesion and delayed oral transit, premature spillage. Swallow initiation delayed, with prolonged pooling in the valleculae (puree) and pyriforms (honey, thin liquids). Pharyngeal stage characterized by mildly decreased base of tongue retraction, decreased hyolaryngeal excursion with delayed, incomplete closure of the laryngeal vestibule. There is penetration before the swallow with thin liquids, with eventual sensed aspiration, and penetration/aspiration during the swallow with honey-thick liquids due to spillage into the airway from liquid pooled in the pyriforms. Pt did sense aspiration, and SLP was able to remove some material via oral suction, though some barium did remain in the trachea. There was mild, shallow penetration with puree which was transient. Pt easily distracted during testing by anterior spillage and environmental commotion, requiring frequent verbal and tactile cues to refocus and initiate swallow. Son and daughter reviewed the study in real time and SLP explained findings, recommendations and rationale after completion of the exam.  Recommend dys 1, pudding thick liquids by teaspoon, with full supervision and assistance, meds crushed. Feeding will be slow and effortful, and I anticipate pt will require significant cuing. Educated son and daughter in strategies including reducing environmental distractions, monitoring for swallow initiation, and cuing techniques. Will follow for tolerance, interventions to improve swallow function, caregiver education, consider repeat instrumental assessment if improvements at bedside. SLP Visit Diagnosis Dysphagia, oral phase (R13.11);Dysphagia, oropharyngeal phase (R13.12);Dysphagia, pharyngeal phase (R13.13) Attention and concentration deficit following -- Frontal lobe and executive function deficit following -- Impact on safety and function Severe aspiration risk   CHL IP TREATMENT RECOMMENDATION 02/22/2018 Treatment Recommendations Therapy as outlined in treatment plan below   Prognosis 02/22/2018 Prognosis for Safe Diet Advancement Fair Barriers to Reach Goals Cognitive deficits;Severity of deficits Barriers/Prognosis Comment -- CHL IP DIET RECOMMENDATION 02/22/2018 SLP Diet Recommendations Dysphagia 1 (Puree) solids;Pudding thick liquid Liquid Administration via Spoon Medication Administration Crushed with puree Compensations Slow rate;Small sips/bites;Minimize environmental distractions Postural Changes Seated upright at 90 degrees   CHL IP OTHER RECOMMENDATIONS 02/22/2018 Recommended Consults --  Oral Care Recommendations Oral care BID;Oral care before and after PO Other Recommendations Order thickener from pharmacy;Prohibited food (jello, ice cream, thin soups);Remove water pitcher;Have oral suction available   CHL IP FOLLOW UP RECOMMENDATIONS 02/22/2018 Follow up Recommendations Skilled Nursing facility   [facility] IP FREQUENCY AND DURATION 02/22/2018 Speech Therapy Frequency (ACUTE ONLY) min 3x week Treatment Duration 2 weeks      CHL IP ORAL PHASE 02/22/2018 Oral Phase Impaired Oral - Pudding Teaspoon -- Oral -  Pudding Cup -- Oral - Honey Teaspoon Left anterior bolus loss;Right anterior bolus loss;Reduced posterior propulsion;Holding of bolus;Delayed oral transit;Decreased bolus cohesion;Premature spillage Oral - Honey Cup Reduced posterior propulsion;Holding of bolus;Delayed oral transit;Decreased bolus cohesion;Premature spillage Oral - Nectar Teaspoon -- Oral - Nectar Cup -- Oral - Nectar Straw -- Oral - Thin Teaspoon Left anterior bolus loss;Right anterior bolus loss Oral - Thin Cup Left anterior bolus loss;Right anterior bolus loss;Reduced posterior propulsion;Holding of bolus;Delayed oral transit;Decreased bolus cohesion;Premature spillage Oral - Thin Straw -- Oral - Puree Reduced posterior propulsion;Holding of bolus;Delayed oral transit;Decreased bolus cohesion Oral - Mech Soft -- Oral - Regular -- Oral - Multi-Consistency -- Oral - Pill -- Oral Phase - Comment --  CHL IP PHARYNGEAL PHASE 02/22/2018 Pharyngeal Phase Impaired Pharyngeal- Pudding Teaspoon -- Pharyngeal -- Pharyngeal- Pudding Cup -- Pharyngeal -- Pharyngeal- Honey Teaspoon Reduced epiglottic inversion;Reduced anterior laryngeal mobility;Reduced laryngeal elevation;Reduced airway/laryngeal closure;Reduced tongue base retraction;Penetration/Aspiration during swallow;Pharyngeal residue - valleculae Pharyngeal Material enters airway, remains ABOVE vocal cords and not ejected out Pharyngeal- Honey Cup Reduced epiglottic inversion;Reduced anterior laryngeal mobility;Reduced laryngeal elevation;Reduced airway/laryngeal closure;Reduced tongue base retraction;Penetration/Aspiration during swallow;Trace aspiration;Pharyngeal residue - valleculae Pharyngeal Material enters airway, passes BELOW cords and not ejected out despite cough attempt by patient Pharyngeal- Nectar Teaspoon -- Pharyngeal -- Pharyngeal- Nectar Cup -- Pharyngeal -- Pharyngeal- Nectar Straw -- Pharyngeal -- Pharyngeal- Thin Teaspoon -- Pharyngeal -- Pharyngeal- Thin Cup Reduced epiglottic  inversion;Reduced anterior laryngeal mobility;Reduced laryngeal elevation;Reduced airway/laryngeal closure;Reduced tongue base retraction;Penetration/Aspiration before swallow;Penetration/Aspiration during swallow;Moderate aspiration;Pharyngeal residue - valleculae;Pharyngeal residue - pyriform Pharyngeal Material enters airway, passes BELOW cords and not ejected out despite cough attempt by patient Pharyngeal- Thin Straw -- Pharyngeal -- Pharyngeal- Puree Reduced epiglottic inversion;Reduced anterior laryngeal mobility;Reduced laryngeal elevation;Reduced airway/laryngeal closure;Reduced tongue base retraction;Penetration/Aspiration during swallow;Pharyngeal residue - valleculae Pharyngeal Material enters airway, remains ABOVE vocal cords then ejected out;Material does not enter airway Pharyngeal- Mechanical Soft -- Pharyngeal -- Pharyngeal- Regular -- Pharyngeal -- Pharyngeal- Multi-consistency -- Pharyngeal -- Pharyngeal- Pill -- Pharyngeal -- Pharyngeal Comment --  CHL IP CERVICAL ESOPHAGEAL PHASE 02/22/2018 Cervical Esophageal Phase WFL Pudding Teaspoon -- Pudding Cup -- Honey Teaspoon -- Honey Cup -- Nectar Teaspoon -- Nectar Cup -- Nectar Straw -- Thin Teaspoon -- Thin Cup -- Thin Straw -- Puree -- Mechanical Soft -- Regular -- Multi-consistency -- Pill -- Cervical Esophageal Comment -- Deneise Lever, MS, CCC-SLP Speech-Language Pathologist [phone] No flowsheet data found. Aliene Altes 02/22/2018, 3:40 PM              Ct Head Code Stroke Wo Contrast  Result Date: 02/21/2018 CLINICAL DATA:  Code stroke. LEFT facial droop with slurred speech. Generalized weakness. Altered mental status. EXAM: CT HEAD WITHOUT CONTRAST TECHNIQUE: Contiguous axial images were obtained from the base of the skull through the vertex without intravenous contrast. COMPARISON:  CT head 09/30/2016. FINDINGS: Brain: No evidence for acute infarction, hemorrhage, mass lesion, hydrocephalus, or extra-axial fluid. Advanced atrophy.  Chronic microvascular ischemic change. Vascular: Calcification of the cavernous internal carotid arteries  and distal vertebral arteries consistent with cerebrovascular atherosclerotic disease. No signs of intracranial large vessel occlusion. Skull: Normal. Negative for fracture or focal lesion. Sinuses/Orbits: No acute finding. Other: Compared with priors, the RIGHT frontal parenchymal hemorrhage has resolved. ASPECTS [facility] Stroke Program Early CT Score) - Ganglionic level infarction (caudate, lentiform nuclei, internal capsule, insula, M1-M3 cortex): 7 - Supraganglionic infarction (M4-M6 cortex): 3 Total score (0-10 with 10 being normal): 10 IMPRESSION: 1. Atrophy and small vessel disease. No acute intracranial findings. 2. ASPECTS is 10. 3. These results were communicated to Dr. Cheral Marker at 11:03 amon 3/16/2019by text page via the [facility] messaging system. Electronically Signed   By: Staci Righter M.D.   On: 02/21/2018 11:05     Sherene Sires, DO 02/25/2018, 6:25 AM PGY-1, [place] Intern pager: [phone], text pages welcome

## 2018-02-25 NOTE — Progress Notes (Signed)
Patient's daughter informed patient will have to be turned every 4 hours to prevent patient from getting pressure ulcers. Patient daughter requested for patient to be turned every 4 hours instead of every 2 hours.

## 2018-02-25 NOTE — Progress Notes (Signed)
  Speech Language Pathology Treatment: Dysphagia  Patient Details Name: Martha Walters MRN: [id number] DOB: [birth year] Today's Date: 02/25/2018 Time: 8295-6213 SLP Time Calculation (min) (ACUTE ONLY): 20 min  Assessment / Plan / Recommendation Clinical Impression  Pt reportedly had difficulty tolerating breakfast with such frequent coughing that tray was removed.  At lunchtime, pt was sitting in recliner, alert/participatory, and tolerated meal very well.  She required 1:1 assist and moderate cues to swallow with effort, clear throat intermittently and dry swallow occasionally to reduce residue, but there were no overt s/s of aspiration.  Daughter was present - we reviewed basic precautions to minimize aspiration, but also discussed our inability to eliminate aspiration risk.  She acknowledged these difficulties. SLP will continue to follow for dysphagia and dysarthria.   HPI HPI: Martha Walters a 84 y.o.femalepresenting with AMS. 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 Per MD pt has had PNA recently.      SLP Plan  Continue with current plan of care       Recommendations  Diet recommendations: Dysphagia 1 (puree);Pudding-thick liquid Liquids provided via: Teaspoon Medication Administration: Crushed with puree Supervision: Patient able to self feed;Full supervision/cueing for compensatory strategies Compensations: Minimize environmental distractions;Slow rate;Small sips/bites;Effortful swallow Postural Changes and/or Swallow Maneuvers: Seated upright 90 degrees;Upright 30-60 min after meal                Oral Care Recommendations: Oral care BID Follow up Recommendations: Skilled Nursing facility SLP Visit Diagnosis: Dysphagia, oropharyngeal phase (R13.12) Plan: Continue with current plan of care       GO               Martha Walters 02/25/2018, 2:31 PM

## 2018-02-25 NOTE — Progress Notes (Signed)
Subjective:  Patient denies any chest pain or shortness of breath. Denies any palpitations.patient noted to be in A. Fib with moderate to rapid ventricular response. No further pauses.  Objective:  Vital Signs in the last 24 hours: Temp:  [98 F (36.7 C)-99.1 F (37.3 C)] 98 F (36.7 C) (03/20 1158) Pulse Rate:  [102-115] 102 (03/20 1158) Resp:  [16-22] 16 (03/20 1158) BP: (120-160)/(50-68) 136/50 (03/20 1158) SpO2:  [96 %-99 %] 98 % (03/20 1158)  Intake/Output from previous day: No intake/output data recorded. Intake/Output from this shift: No intake/output data recorded.  Physical Exam: Neck: no adenopathy, no carotid bruit, no JVD and supple, symmetrical, trachea midline Lungs: clear to auscultation bilaterally Heart: irregularly irregular rhythm, S1, S2 normal and soft systolic murmur noted Abdomen: soft, non-tender; bowel sounds normal; no masses,  no organomegaly Extremities: extremities normal, atraumatic, no cyanosis or edema  Lab Results: Recent Labs    02/24/18 0629 02/25/18 0538  WBC 42.3* 52.8*  HGB 11.2* 10.8*  PLT 47* 47*   Recent Labs    02/24/18 0629 02/25/18 0538  NA 135 135  K 4.0 4.3  CL 105 106  CO2 19* 22  GLUCOSE 190* 178*  BUN 14 14  CREATININE 0.96 0.88   No results for input(s): TROPONINI in the last 72 hours.  Invalid input(s): CK, MB Hepatic Function Panel No results for input(s): PROT, ALBUMIN, AST, ALT, ALKPHOS, BILITOT, BILIDIR, IBILI in the last 72 hours. No results for input(s): CHOL in the last 72 hours. No results for input(s): PROTIME in the last 72 hours.  Imaging: Imaging results have been reviewed and No results found.  Cardiac Studies:  Assessment/Plan:  New-onset A. Fib with RVR chadsvasc score of 7 Status postSymptomatic sinus bradycardia with pauses/sinus arrest Tachybradycardia syndrome Sick sinus syndrome Chronic left bundle branch block Acute right para pontine CVA Hypertension Diabetes  mellitus CLL Early dementia Thrombocytopenia Hypothyroidism Dysphagia Possible right community-acquired pneumonia Plan Patient on chronic anticoagulation and INR in therapeutic range. Will and low-dose Lopressor as per orders   LOS: 4 days    Charolette Forward 02/25/2018, 12:22 PM

## 2018-02-25 NOTE — Progress Notes (Signed)
Patient ID: Martha Walters, female   DOB: 05-10-1929, 84 y.o.   MRN: [id number]  This NP visited patient at the bedside as a follow up to  yesterday's [place], daughter Loletha Carrow at bedside.  Ongoing conversation regarding current medical situation specifically as it relates to her dysphasia and ability to support herself both nutritionally and hydration.  Speech is more garbled today and patient is having difficulty with offered thickened liquids.  Audible wet throat sounds.  Daughter reports that she noted coughing during attempts at breakfast.  Vision and family remain hopeful for improvement and are open to all offered and available medical interventions to prolong life.  They have brought to my attention on multiple occasions that they would consider artificial feeding via PEG.  Discussed with patient and family at bedside  the importance of continued conversation with each other and their  medical providers regarding overall plan of care and treatment options,  ensuring decisions are within the context of the patients values and GOCs.  Questions and concerns addressed   Discussed with Family medicine resident  Time in   0900        Time out  0935    Total time spent on the unit was 35 minutes  Greater than 50% of the time was spent in counseling and coordination of care  Wadie Lessen NP  Palliative Medicine Team Team Phone # [phone] Pager [phone]  Note---*Checked back in this afternoon  and nursing reports the patient did much better with her oral intake and swallow once she was out of bed to the chair and sitting up.

## 2018-02-25 NOTE — Progress Notes (Signed)
Occupational Therapy Treatment Patient Details Name: Martha Walters MRN: [id number] DOB: [birth year] Today's Date: 02/25/2018    History of present illness Pt is an 84 yo female admitted through the ED on 02/21/18 as a code stroke with slurred speech, difficulty transferring, L lateral lean and facial droop. 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 PMH significant for HTN, DM2, Thyroid disease.    OT comments  Pt demonstrating some progress toward OT goals. She was able to sit at EOB  today during grooming tasks following core activation exercises and progressed from initial mod assist to brief period of min guard assist. Facilitated improved attention to L UE during grooming tasks to apply lotion to L hand. Noted decreased accuracy of functional reaching. Additionally facilitated weight bearing on B UE with functional reaching tasks across midline with improved tolerance and core engagement this session. Pt continues to require total assist +2 for simulated toilet transfer and was very easily distracted by external stimuli during this task limited success. Will plan to minimize distractions to facilitate improvement next session. Will continue to follow while admitted.    Follow Up Recommendations  SNF;Supervision/Assistance - 24 hour    Equipment Recommendations  Other (comment)(defer to next venue of care)    Recommendations for Other Services PT consult;Speech consult    Precautions / Restrictions Precautions Precautions: Fall Restrictions Weight Bearing Restrictions: No       Mobility Bed Mobility Overal bed mobility: Needs Assistance Bed Mobility: Supine to Sit     Supine to sit: Max assist;+2 for physical assistance     General bed mobility comments: Able to initiate BLE but requiring max assist +2 ultimately to come to EOB.   Transfers Overall transfer level: Needs  assistance Equipment used: 2 person hand held assist Transfers: Squat Pivot Transfers     Squat pivot transfers: Total assist;+2 physical assistance     General transfer comment: Total assist of 2 for transfer to chair.     Balance Overall balance assessment: Needs assistance Sitting-balance support: Bilateral upper extremity supported;Feet supported Sitting balance-Leahy Scale: Zero Sitting balance - Comments: Able to briefly maintain balance with min guard assist. Progressed from mod to min to min guard assist with core activation exercises.    Standing balance support: Bilateral upper extremity supported;During functional activity Standing balance-Leahy Scale: Zero                             ADL either performed or assessed with clinical judgement   ADL Overall ADL's : Needs assistance/impaired     Grooming: Min guard;Wash/dry face;Sitting(applying lotion) Grooming Details (indicate cue type and reason): Min-min guard assist for seated balance.                  Toilet Transfer: +2 for physical assistance;Squat-pivot;Total assistance             General ADL Comments: Continues to require total assist for dressing/bathing tasks. Was able to complete simulated toilet transfer with total assistance of two this sesion. Facilitated attention to L UE with lotion application task.       Vision       Perception     Praxis      Cognition Arousal/Alertness: Awake/alert Behavior During Therapy: Flat affect Overall Cognitive Status: History of cognitive impairments - at baseline Area of Impairment: Problem solving  Current Attention Level: Selective   Following Commands: Follows one step commands consistently;Follows multi-step commands inconsistently     Problem Solving: Slow processing;Decreased initiation;Difficulty sequencing;Requires verbal cues;Requires tactile cues General Comments: Able to follow commands with  increased time and attend to seated tasks with minimal distractions. Maintenance staff entering room was highly distracting to pt and limited her ability to assist with transfer.         Exercises     Shoulder Instructions       General Comments Daughter present during session and very concerned over her mother's mobility and safety. Re-assured that therapy staff taking the most care to maintain safety. Pt's daughter was receptive and appreciative.     Pertinent Vitals/ Pain       Pain Assessment: No/denies pain  Home Living                                          Prior Functioning/Environment              Frequency  Min 2X/week        Progress Toward Goals  OT Goals(current goals can now be found in the care plan section)  Progress towards OT goals: Progressing toward goals  Acute Rehab OT Goals Patient Stated Goal: family would like patient to got to rehab facility  OT Goal Formulation: With family Time For Goal Achievement: 03/08/18 Potential to Achieve Goals: Good  Plan Discharge plan remains appropriate    Co-evaluation                 AM-PAC PT "6 Clicks" Daily Activity     Outcome Measure   Help from another person eating meals?: Total Help from another person taking care of personal grooming?: A Lot Help from another person toileting, which includes using toliet, bedpan, or urinal?: A Lot Help from another person bathing (including washing, rinsing, drying)?: A Lot Help from another person to put on and taking off regular upper body clothing?: A Lot Help from another person to put on and taking off regular lower body clothing?: Total 6 Click Score: 10    End of Session Equipment Utilized During Treatment: Gait belt  OT Visit Diagnosis: Unsteadiness on feet (R26.81);Other abnormalities of gait and mobility (R26.89);Muscle weakness (generalized) (M62.81);Other symptoms and signs involving cognitive function   Activity  Tolerance Patient tolerated treatment well   Patient Left in chair;with call bell/phone within reach;with family/visitor present   Nurse Communication Mobility status(transfer technique back to bed)        Time: 1039-1100 OT Time Calculation (min): 21 min  Charges: OT General Charges $OT Visit: 1 Visit OT Treatments $Self Care/Home Management : 8-22 mins  Norman Herrlich, MS OTR/L  Pager: [place] [facility] 02/25/2018, 3:11 PM

## 2018-02-25 NOTE — Progress Notes (Signed)
MD notified that patient got very choked up on her breakfast and dinner. She did ok with her lunch but she coughed the entire time she ate breakfast and dinner.

## 2018-02-25 NOTE — Plan of Care (Signed)
  Progressing Education: Knowledge of General Education information will improve 02/25/2018 1555 - Progressing by Darletta Moll, RN Health Behavior/Discharge Planning: Ability to manage health-related needs will improve 02/25/2018 1555 - Progressing by Darletta Moll, RN Clinical Measurements: Ability to maintain clinical measurements within normal limits will improve 02/25/2018 1555 - Progressing by Darletta Moll, RN Will remain free from infection 02/25/2018 1555 - Progressing by Darletta Moll, RN Diagnostic test results will improve 02/25/2018 1555 - Progressing by Darletta Moll, RN Respiratory complications will improve 02/25/2018 1555 - Progressing by Darletta Moll, RN Cardiovascular complication will be avoided 02/25/2018 1555 - Progressing by Darletta Moll, RN Activity: Risk for activity intolerance will decrease 02/25/2018 1555 - Progressing by Darletta Moll, RN Nutrition: Adequate nutrition will be maintained 02/25/2018 1555 - Progressing by Darletta Moll, RN Coping: Level of anxiety will decrease 02/25/2018 1555 - Progressing by Darletta Moll, RN Elimination: Will not experience complications related to bowel motility 02/25/2018 1555 - Progressing by Darletta Moll, RN Will not experience complications related to urinary retention 02/25/2018 1555 - Progressing by Darletta Moll, RN Pain Managment: General experience of comfort will improve 02/25/2018 1555 - Progressing by Darletta Moll, RN Safety: Ability to remain free from injury will improve 02/25/2018 1555 - Progressing by Darletta Moll, RN Skin Integrity: Risk for impaired skin integrity will decrease 02/25/2018 1555 - Progressing by Darletta Moll, RN Education: Knowledge of disease or condition will improve 02/25/2018 1555 - Progressing by Darletta Moll, RN Knowledge of secondary prevention will improve 02/25/2018 1555 - Progressing by Darletta Moll, RN Knowledge of patient specific risk factors addressed and post discharge goals established will improve 02/25/2018 1555 - Progressing by Darletta Moll, RN Coping: Will verbalize positive feelings about self 02/25/2018 1555 - Progressing by Darletta Moll, RN Will identify appropriate support needs 02/25/2018 1555 - Progressing by Darletta Moll, RN Health Behavior/Discharge Planning: Ability to manage health-related needs will improve 02/25/2018 1555 - Progressing by Darletta Moll, RN Self-Care: Ability to participate in self-care as condition permits will improve 02/25/2018 1555 - Progressing by Darletta Moll, RN Verbalization of feelings and concerns over difficulty with self-care will improve 02/25/2018 1555 - Progressing by Darletta Moll, RN Ability to communicate needs accurately will improve 02/25/2018 1555 - Progressing by Darletta Moll, RN Nutrition: Risk of aspiration will decrease 02/25/2018 1555 - Progressing by Darletta Moll, RN Dietary intake will improve 02/25/2018 1555 - Progressing by Darletta Moll, RN

## 2018-02-25 NOTE — Care Management Important Message (Signed)
Important Message  Patient Details  Name: Martha Walters MRN: [id number] Date of Birth: [birth year]   Medicare Important Message Given:  Yes    Juanell Saffo 02/25/2018, 12:50 PM

## 2018-02-26 ENCOUNTER — Inpatient Hospital Stay (HOSPITAL_COMMUNITY): Payer: Medicare Other

## 2018-02-26 LAB — CBC
HEMATOCRIT: 34.5 % — AB (ref 36.0–46.0)
HEMOGLOBIN: 10.9 g/dL — AB (ref 12.0–15.0)
MCH: 29.7 pg (ref 26.0–34.0)
MCHC: 31.6 g/dL (ref 30.0–36.0)
MCV: 94 fL (ref 78.0–100.0)
Platelets: 46 10*3/uL — ABNORMAL LOW (ref 150–400)
RBC: 3.67 MIL/uL — AB (ref 3.87–5.11)
RDW: 16.8 % — ABNORMAL HIGH (ref 11.5–15.5)
WBC: 54.5 10*3/uL (ref 4.0–10.5)

## 2018-02-26 LAB — BASIC METABOLIC PANEL
Anion gap: 9 (ref 5–15)
BUN: 14 mg/dL (ref 6–20)
CHLORIDE: 108 mmol/L (ref 101–111)
CO2: 22 mmol/L (ref 22–32)
Calcium: 8 mg/dL — ABNORMAL LOW (ref 8.9–10.3)
Creatinine, Ser: 0.83 mg/dL (ref 0.44–1.00)
GFR calc non Af Amer: >60 mL/min (ref >60)
Glucose, Bld: 209 mg/dL — ABNORMAL HIGH (ref 65–99)
POTASSIUM: 4.4 mmol/L (ref 3.5–5.1)
Sodium: 139 mmol/L (ref 135–145)

## 2018-02-26 LAB — GLUCOSE, CAPILLARY
GLUCOSE-CAPILLARY: 268 mg/dL — AB (ref 65–99)
GLUCOSE-CAPILLARY: 337 mg/dL — AB (ref 65–99)
GLUCOSE-CAPILLARY: 289 mg/dL — AB (ref 65–99)
Glucose-Capillary: 204 mg/dL — ABNORMAL HIGH (ref 65–99)

## 2018-02-26 LAB — PROTIME-INR
INR: 2.64
Prothrombin Time: 28 seconds — ABNORMAL HIGH (ref 11.4–15.2)

## 2018-02-26 MED ORDER — WARFARIN SODIUM 5 MG PO TABS
5.0000 mg | ORAL_TABLET | Freq: Once | ORAL | Status: AC
  Administered 2018-02-26: 5 mg via ORAL
  Filled 2018-02-26: qty 1

## 2018-02-26 NOTE — Clinical Social Work Note (Signed)
Clinical Social Work Assessment  Patient Details  Name: Martha Walters MRN: [id number] Date of Birth: 01/08/1929  Date of referral:  02/24/18               Reason for consult:  Facility Placement                Permission sought to share information with:  Facility Contact Representative, Family Supports Permission granted to share information::  Yes, Verbal Permission Granted  Name::     Vickie, Rosa  Agency::  SNF  Relationship::  Daughters  Contact Information:     Housing/Transportation Living arrangements for the past 2 months:  Single Family Home Source of Information:  Adult Children Patient Interpreter Needed:  None Criminal Activity/Legal Involvement Pertinent to Current Situation/Hospitalization:  No - Comment as needed Significant Relationships:  Adult Children, Spouse Lives with:  Self, Spouse Do you feel safe going back to the place where you live?  Yes Need for family participation in patient care:  Yes (Comment)  Care giving concerns:  Patient lives at home with spouse and has caregiver support, but will need short term rehab at discharge.   Social Worker assessment / plan:  CSW met with patient and patient's daughter at bedside to discuss decision on SNF placement and bed offers. CSW discussed options for transferring if patient's preferred facility doesn't have a bed. CSW answered patient's daughter's questions. CSW to continue to follow.  Employment status:  Retired Insurance information:  Medicare PT Recommendations:  Skilled Nursing Facility Information / Referral to community resources:  Skilled Nursing Facility  Patient/Family's Response to care:  Patient and family agreeable to SNF placement.  Patient/Family's Understanding of and Emotional Response to Diagnosis, Current Treatment, and Prognosis:  Patient's daughter discussed how the family would really like the patient placed at [facility] in [place], because it is close to home and the patient has family that  works there and would be able to check on her. Patient's daughter acknowledged understanding that she couldn't stay at the hospital waiting on a bed. Patient's daughter discussed also being agreeable to [facility].  Emotional Assessment Appearance:  Appears stated age Attitude/Demeanor/Rapport:  Unable to Assess Affect (typically observed):  Unable to Assess Orientation:  Oriented to Self Alcohol / Substance use:  Not Applicable Psych involvement (Current and /or in the community):  No (Comment)  Discharge Needs  Concerns to be addressed:  Care Coordination Readmission within the last 30 days:  No Current discharge risk:  Dependent with Mobility Barriers to Discharge:  Continued Medical Work up    M , LCSW 02/26/2018, 9:18 AM  

## 2018-02-26 NOTE — Progress Notes (Signed)
[place] for warfarin Indication: history of recurrent DVTs  Allergies  Allergen Reactions  . Phenergan [Promethazine Hcl] Other (See Comments)    Over Sedation  . Tetanus Toxoid Swelling  . Avelox [Moxifloxacin] Rash  . Levaquin [Levofloxacin In D5w] Hives  . Levofloxacin Hives  . Xarelto [Rivaroxaban]     Patient Measurements: Height: 5\' 3"  (160 cm) Weight: 184 lb 11.9 oz (83.8 kg) IBW/kg (Calculated) : 52.4  Vital Signs: Temp: 97.6 F (36.4 C) (03/21 0901) Temp Source: Axillary (03/21 0901) BP: 121/64 (03/21 0901) Pulse Rate: 103 (03/21 0901)  Labs: Recent Labs    02/24/18 0629 02/25/18 0538 02/26/18 0651  HGB 11.2* 10.8* 10.9*  HCT 35.3* 33.7* 34.5*  PLT 47* 47* 46*  LABPROT 20.5* 24.1* 28.0*  INR 1.77 2.18 2.64  CREATININE 0.96 0.88 0.83    Estimated Creatinine Clearance: 49 mL/min (by C-G formula based on SCr of 0.83 mg/dL).    Assessment: 78 yof on warfarin PTA for hx of recurrent DVTs - home regimen reported as 10 mg daily. Last dose was on 3/15 at 1900. INR on admission was 1.47.   MRI found small area of acute brainstem infarction affecting R paramedian pons. Okay per medical teams to resume warfarin. On concurrent azithromycin and augmentin, which can impact INR sensitivity.   INR = 2.18 -> 2.64  Goal of Therapy:  INR 2-3 Monitor platelets by anticoagulation protocol: Yes   Plan:  Warfarin 5 mg po x 1 tonight Will monitor daily INR and CBC Monitor for signs/symptoms of bleeding  Thank you Anette Guarneri, PharmD [phone] 02/26/2018,11:33 AM

## 2018-02-26 NOTE — Progress Notes (Signed)
Family Medicine Teaching Service Daily Progress Note Intern Pager: [phone]  Patient name: Martha Walters Medical record number: [id number] Date of birth: Jul 16, 1929 Age: 84 y.o. Gender: female  Primary Care Provider: Egbert Garibaldi, PA-C Consultants: neurology Code Status: DNR, confirmed with children as patient is demented  Pt Overview and Major Events to Date:  3/16 admitted with new pons CVA  Assessment and Plan: Martha Walters is a 84 y.o. female presenting with AMS. 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   Acute R paramedian pons CVA:  MRI confirmed R paramedian pons infarct. Neurology following.   Per discussion with son and daughter, they would like to decline MRA and carotid US as they would not elect for surgical intervention if there were significant findings on these tests.  SLP recommends dys1, PT/OT recommend SNF.  Family not electing for invasive interventions at this time   Echo: Left ventricle with mild concentric hypertrophy. EF-55/60. Aortic valve w/trivial regurgitation.  - Mitral valve w/ Calcified annulus/ severely calcified leaflets/ mild stenosis/severe regurgitation.  Left atrium w/ mild dilation. Pulmonary arteries w/ Systolic pressure moderately increased.  No statin at request of family, risks were shared. -neuro checks  -stroke precautions -will end permissive HTN 3/23 -warfarin per pharm  New afib w/o RVR: started on 12.5BID metoprolol 3/20.  Cautious on rate control given history of sinus pauses. -continue 12.5mg  metoprolol BID if no significant sinus pauses or rated rop -was held overnight for monitoring, stable for d/c to snf  Hypocalcemia: 3/20 7.8 corrected to 8.2.   AM labs not resulted by  -ordered 1GM calcium gluconate -mag lab add-on  Vomiting overnight: resolved if not reclined flat   Dysphagia- Dysphagia 1 diet.  Still with occasional coughing reported during eating.  Family is considering being  open to PEG if required -MBS ordered  Community Acquired Pneumonia: Daughter reports history of recurrent pneumonias.  Concern for possible aspiration as a source.  Speech eval as above.  Chest x-ray with findings suggestive for right infrahilar airspace disease.  Malignancy is also a concern at this time.  Will trial antibiotics for community-acquired pneumonia and recommend repeat of chest x-ray in 3-4 weeks to assess for possible underlying pneumonia. --augmentin (3/18- )   ----s/p CTX/azithromycin  CLL: Leukocytosis to 77 - atypical lympocytes + smudge cells on diff. Hx of known CLL, follows with . Concern for hypercoagulable state and subtherapeutic INR as a possible cause of potential CVA as above.  -monitor on daily CBC  -consider heme onc vs calling her [facility] hematologist   Thrombocytopenia: appears chronic per Care Everywhere chart review.  -monitor on CBC   Hx of recurrent DVTs: daughter report on warfarin for this. Dosed at home by pill box through PCP, daughter denies missing doses. Subtherapeutic INR.  -hold pending further CVA workup above  Hx of GI ulcer: -no indication of acute bleed currently, will monitor  T2DM: on lantus 28U q am at home per daugther. A1c 8.8. NPO, will hold home lantus. -sSSI   Swelling in hands: could not take rings off due to swelling.  No immediate concern for circulation -elevated hands and will try with vaseline later in day.  R lateral 5th toe wound: appears chronic, question poor perfusion. Does not appear infected.  -wound care consult  Hypothyroidism: hx of synthroid as home med.  TSH wnl -continue synthroid  Dementia- -home citalopram  Gout-  -Home allopurinol  FEN/GI: dys 1 Prophylaxis: SCDs  Disposition: modified barium swallow  needed prior to d/c  Subjective:  Discussed plan with daughter and patient.  Reassured that metoprolol did not drop BP overnight.   Shared concern about progress in feeding, will see SLP  today  Objective: Temp:  [97.8 F (36.6 C)-99.1 F (37.3 C)] 97.8 F (36.6 C) (03/21 0410) Pulse Rate:  [90-115] 102 (03/21 0410) Resp:  [16-18] 18 (03/21 0410) BP: (101-160)/(48-67) 136/66 (03/21 0410) SpO2:  [96 %-98 %] 97 % (03/21 0410) Physical Exam: General: frail elderly female in NAD, unchanged from prior day Cardiovascular: RRR, 3/6 systolic murmur Respiratory: CTAB, easy WOB Abdomen: SNTND, +BS Extremities: some puffiness in hands, rings were a little tight but with distal sensation/perfusion intact Neuro: persistent L facial droop, mild confusion and slurring of words unchanged from prior, sensation intact bilaterally  Laboratory: Recent Labs  Lab 02/23/18 0500 02/24/18 0629 02/25/18 0538  WBC 43.5* 42.3* 52.8*  HGB 10.5* 11.2* 10.8*  HCT 33.0* 35.3* 33.7*  PLT 52* 47* 47*   Recent Labs  Lab 02/21/18 1105  02/23/18 0500 02/24/18 0629 02/25/18 0538  NA 134*   < > 137 135 135  K 4.7   < > 3.8 4.0 4.3  CL 101   < > 106 105 106  CO2 25   < > 21* 19* 22  BUN 12   < > 13 14 14   CREATININE 0.79   < > 0.93 0.96 0.88  CALCIUM 9.0   < > 7.8* 7.8* 7.8*  PROT 5.7*  --   --   --   --   BILITOT 0.8  --   --   --   --   ALKPHOS 91  --   --   --   --   ALT 26  --   --   --   --   AST 38  --   --   --   --   GLUCOSE 137*   < > 160* 190* 178*   < > = values in this interval not displayed.    Imaging/Diagnostic Tests: Dg Chest 2 View  Result Date: 02/21/2018 CLINICAL DATA:  Chest pain EXAM: CHEST - 2 VIEW COMPARISON:  10/31/2017 FINDINGS: Lungs are under aerated. There is focal opacity in the right infrahilar region. No pneumothorax. No pleural effusion. IMPRESSION: Findings are worrisome for right infrahilar airspace disease. Followup PA and lateral chest X-ray is recommended in 3-4 weeks following trial of antibiotic therapy to ensure resolution and exclude underlying malignancy. Electronically Signed   By: Marybelle Killings M.D.   On: 02/21/2018 13:09   Mr Brain Wo  Contrast  Result Date: 02/21/2018 CLINICAL DATA:  Code stroke, mildly altered, unable to transfer. EXAM: MRI HEAD WITHOUT CONTRAST TECHNIQUE: Multiplanar, multiecho pulse sequences of the brain and surrounding structures were obtained without intravenous contrast. COMPARISON:  Code stroke CT earlier today. FINDINGS: Brain: There is a small tubular area of restricted diffusion affecting the RIGHT paramedian pons consistent with acute infarction. No other areas of involvement are seen. There is no hemorrhage, mass lesion, or extra-axial fluid. There is extensive atrophy with prominence of the ventricles, cisterns, and sulci. Hypoattenuation of white matter representing small vessel disease is observed. Vascular: Flow voids are maintained throughout the carotid, basilar, and vertebral arteries. There is a small focus of chronic hemorrhage in the RIGHT frontal lobe, either sequelae of old trauma or ischemia. Skull and upper cervical spine: Normal marrow signal. Sinuses/Orbits: Negative. Other: Correlating with the earlier CT, the infarct is not visible. IMPRESSION:  Small area of acute brainstem infarction affecting the RIGHT paramedian pons, without associated large vessel occlusion. This infarction is consistent with a small vessel ischemic insult secondary to hypertension and/or diabetes. Advanced atrophy with chronic microvascular ischemic change elsewhere. No visible acute hemorrhage. Electronically Signed   By: Staci Righter M.D.   On: 02/21/2018 16:20   Dg Swallowing Func-speech Pathology  Result Date: 02/22/2018 Objective Swallowing Evaluation: Type of Study: MBS-Modified Barium Swallow Study  Patient Details Name: Amandeep Hogston MRN: [id number] Date of Birth: 1929/11/27 Today's Date: 02/22/2018 Time: SLP Start Time (ACUTE ONLY): 9323 -SLP Stop Time (ACUTE ONLY): 1505 SLP Time Calculation (min) (ACUTE ONLY): 20 min Past Medical History: Past Medical History: Diagnosis Date . Bruises easily  . Clotting disorder  ([place])  . Diabetes mellitus without complication ([place])  . Hypertension  . Thyroid disease  Past Surgical History: Past Surgical History: Procedure Laterality Date . trigger finger injection   HPI: Dalynn Jhaveri a 84 y.o.femalepresenting with AMS. 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 Per MD pt has had PNA recently.  Subjective: Pt arrives to [place] with son and daughter Assessment / Plan / Recommendation CHL IP CLINICAL IMPRESSIONS 02/22/2018 Clinical Impression Patient presents with severe oropharyngeal dysphagia due to sensory, motor and cognitive impairments. Oral stage is characterized by prolonged oral holding, decreased oral manipulation leading to reduced bolus cohesion and delayed oral transit, premature spillage. Swallow initiation delayed, with prolonged pooling in the valleculae (puree) and pyriforms (honey, thin liquids). Pharyngeal stage characterized by mildly decreased base of tongue retraction, decreased hyolaryngeal excursion with delayed, incomplete closure of the laryngeal vestibule. There is penetration before the swallow with thin liquids, with eventual sensed aspiration, and penetration/aspiration during the swallow with honey-thick liquids due to spillage into the airway from liquid pooled in the pyriforms. Pt did sense aspiration, and SLP was able to remove some material via oral suction, though some barium did remain in the trachea. There was mild, shallow penetration with puree which was transient. Pt easily distracted during testing by anterior spillage and environmental commotion, requiring frequent verbal and tactile cues to refocus and initiate swallow. Son and daughter reviewed the study in real time and SLP explained findings, recommendations and rationale after completion of the exam. Recommend dys 1, pudding thick  liquids by teaspoon, with full supervision and assistance, meds crushed. Feeding will be slow and effortful, and I anticipate pt will require significant cuing. Educated son and daughter in strategies including reducing environmental distractions, monitoring for swallow initiation, and cuing techniques. Will follow for tolerance, interventions to improve swallow function, caregiver education, consider repeat instrumental assessment if improvements at bedside. SLP Visit Diagnosis Dysphagia, oral phase (R13.11);Dysphagia, oropharyngeal phase (R13.12);Dysphagia, pharyngeal phase (R13.13) Attention and concentration deficit following -- Frontal lobe and executive function deficit following -- Impact on safety and function Severe aspiration risk   CHL IP TREATMENT RECOMMENDATION 02/22/2018 Treatment Recommendations Therapy as outlined in treatment plan below   Prognosis 02/22/2018 Prognosis for Safe Diet Advancement Fair Barriers to Reach Goals Cognitive deficits;Severity of deficits Barriers/Prognosis Comment -- CHL IP DIET RECOMMENDATION 02/22/2018 SLP Diet Recommendations Dysphagia 1 (Puree) solids;Pudding thick liquid Liquid Administration via Spoon Medication Administration Crushed with puree Compensations Slow rate;Small sips/bites;Minimize environmental distractions Postural Changes Seated upright at 90 degrees   CHL IP OTHER RECOMMENDATIONS 02/22/2018 Recommended Consults -- Oral Care Recommendations Oral care BID;Oral care before and after PO  Other Recommendations Order thickener from pharmacy;Prohibited food (jello, ice cream, thin soups);Remove water pitcher;Have oral suction available   CHL IP FOLLOW UP RECOMMENDATIONS 02/22/2018 Follow up Recommendations Skilled Nursing facility   [facility] IP FREQUENCY AND DURATION 02/22/2018 Speech Therapy Frequency (ACUTE ONLY) min 3x week Treatment Duration 2 weeks      CHL IP ORAL PHASE 02/22/2018 Oral Phase Impaired Oral - Pudding Teaspoon -- Oral - Pudding Cup -- Oral - Honey  Teaspoon Left anterior bolus loss;Right anterior bolus loss;Reduced posterior propulsion;Holding of bolus;Delayed oral transit;Decreased bolus cohesion;Premature spillage Oral - Honey Cup Reduced posterior propulsion;Holding of bolus;Delayed oral transit;Decreased bolus cohesion;Premature spillage Oral - Nectar Teaspoon -- Oral - Nectar Cup -- Oral - Nectar Straw -- Oral - Thin Teaspoon Left anterior bolus loss;Right anterior bolus loss Oral - Thin Cup Left anterior bolus loss;Right anterior bolus loss;Reduced posterior propulsion;Holding of bolus;Delayed oral transit;Decreased bolus cohesion;Premature spillage Oral - Thin Straw -- Oral - Puree Reduced posterior propulsion;Holding of bolus;Delayed oral transit;Decreased bolus cohesion Oral - Mech Soft -- Oral - Regular -- Oral - Multi-Consistency -- Oral - Pill -- Oral Phase - Comment --  CHL IP PHARYNGEAL PHASE 02/22/2018 Pharyngeal Phase Impaired Pharyngeal- Pudding Teaspoon -- Pharyngeal -- Pharyngeal- Pudding Cup -- Pharyngeal -- Pharyngeal- Honey Teaspoon Reduced epiglottic inversion;Reduced anterior laryngeal mobility;Reduced laryngeal elevation;Reduced airway/laryngeal closure;Reduced tongue base retraction;Penetration/Aspiration during swallow;Pharyngeal residue - valleculae Pharyngeal Material enters airway, remains ABOVE vocal cords and not ejected out Pharyngeal- Honey Cup Reduced epiglottic inversion;Reduced anterior laryngeal mobility;Reduced laryngeal elevation;Reduced airway/laryngeal closure;Reduced tongue base retraction;Penetration/Aspiration during swallow;Trace aspiration;Pharyngeal residue - valleculae Pharyngeal Material enters airway, passes BELOW cords and not ejected out despite cough attempt by patient Pharyngeal- Nectar Teaspoon -- Pharyngeal -- Pharyngeal- Nectar Cup -- Pharyngeal -- Pharyngeal- Nectar Straw -- Pharyngeal -- Pharyngeal- Thin Teaspoon -- Pharyngeal -- Pharyngeal- Thin Cup Reduced epiglottic inversion;Reduced anterior  laryngeal mobility;Reduced laryngeal elevation;Reduced airway/laryngeal closure;Reduced tongue base retraction;Penetration/Aspiration before swallow;Penetration/Aspiration during swallow;Moderate aspiration;Pharyngeal residue - valleculae;Pharyngeal residue - pyriform Pharyngeal Material enters airway, passes BELOW cords and not ejected out despite cough attempt by patient Pharyngeal- Thin Straw -- Pharyngeal -- Pharyngeal- Puree Reduced epiglottic inversion;Reduced anterior laryngeal mobility;Reduced laryngeal elevation;Reduced airway/laryngeal closure;Reduced tongue base retraction;Penetration/Aspiration during swallow;Pharyngeal residue - valleculae Pharyngeal Material enters airway, remains ABOVE vocal cords then ejected out;Material does not enter airway Pharyngeal- Mechanical Soft -- Pharyngeal -- Pharyngeal- Regular -- Pharyngeal -- Pharyngeal- Multi-consistency -- Pharyngeal -- Pharyngeal- Pill -- Pharyngeal -- Pharyngeal Comment --  CHL IP CERVICAL ESOPHAGEAL PHASE 02/22/2018 Cervical Esophageal Phase WFL Pudding Teaspoon -- Pudding Cup -- Honey Teaspoon -- Honey Cup -- Nectar Teaspoon -- Nectar Cup -- Nectar Straw -- Thin Teaspoon -- Thin Cup -- Thin Straw -- Puree -- Mechanical Soft -- Regular -- Multi-consistency -- Pill -- Cervical Esophageal Comment -- Deneise Lever, MS, CCC-SLP Speech-Language Pathologist [phone] No flowsheet data found. Aliene Altes 02/22/2018, 3:40 PM              Ct Head Code Stroke Wo Contrast  Result Date: 02/21/2018 CLINICAL DATA:  Code stroke. LEFT facial droop with slurred speech. Generalized weakness. Altered mental status. EXAM: CT HEAD WITHOUT CONTRAST TECHNIQUE: Contiguous axial images were obtained from the base of the skull through the vertex without intravenous contrast. COMPARISON:  CT head 09/30/2016. FINDINGS: Brain: No evidence for acute infarction, hemorrhage, mass lesion, hydrocephalus, or extra-axial fluid. Advanced atrophy. Chronic microvascular ischemic  change. Vascular: Calcification of the cavernous internal carotid arteries and distal vertebral arteries consistent with cerebrovascular atherosclerotic disease. No signs  of intracranial large vessel occlusion. Skull: Normal. Negative for fracture or focal lesion. Sinuses/Orbits: No acute finding. Other: Compared with priors, the RIGHT frontal parenchymal hemorrhage has resolved. ASPECTS [facility] Stroke Program Early CT Score) - Ganglionic level infarction (caudate, lentiform nuclei, internal capsule, insula, M1-M3 cortex): 7 - Supraganglionic infarction (M4-M6 cortex): 3 Total score (0-10 with 10 being normal): 10 IMPRESSION: 1. Atrophy and small vessel disease. No acute intracranial findings. 2. ASPECTS is 10. 3. These results were communicated to Dr. Cheral Marker at 11:03 amon 3/16/2019by text page via the [facility] messaging system. Electronically Signed   By: Staci Righter M.D.   On: 02/21/2018 11:05     Sherene Sires, DO 02/26/2018, 6:39 AM PGY-1, [place] Intern pager: [phone], text pages welcome

## 2018-02-26 NOTE — Progress Notes (Signed)
  Speech Language Pathology Treatment: Dysphagia  Patient Details Name: Martha Walters MRN: [id number] DOB: 04/17/1929 Today's Date: 02/26/2018 Time: 9450-3888 SLP Time Calculation (min) (ACUTE ONLY): 15 min  Assessment / Plan / Recommendation Clinical Impression  Pt continues to have waxing and waning swallow function - she tolerated breakfast well, but coughed/choked throughout dinner last night. During our session this am, with 1:1 cues and repositioning, pt ate purees with no overt difficulty until the end of our session when coughing ensued. Pt's family is struggling with decision about nutrition and whether to pursue enteral feeding.  Recommend repeating MBS - results may help family with decision-making.  Encouraged daughter, [name], and affirmed that these questions about nutrition are complex but do not have to impose finality.  Scheduled for 1:30 today.     HPI HPI: Manuela Halbur a 84 y.o.femalepresenting with AMS. 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 Per MD pt has had PNA recently.      SLP Plan  Continue with current plan of care       Recommendations  Diet recommendations: Dysphagia 1 (puree);Pudding-thick liquid Liquids provided via: Teaspoon Medication Administration: Crushed with puree                Oral Care Recommendations: Oral care BID Follow up Recommendations: Skilled Nursing facility SLP Visit Diagnosis: Dysphagia, oropharyngeal phase (R13.12) Plan: Continue with current plan of care       GO                [name] 02/26/2018, 9:00 AM

## 2018-02-26 NOTE — Progress Notes (Signed)
Initial Nutrition Assessment  DOCUMENTATION CODES:   Obesity unspecified  INTERVENTION:  RD will monitor results of SLP evaluation, and provide recommendations as needed. If PEG tube is placed, recommend Jevity 1.2 at 42mL/hr, Pro-stat 63mL daily Provides 1684 calories, 88 grams of protein, 1028mL free water  If she is meeting at least 60% of needs via PO intake may be able to supplement PO intake with 2 or 3 Boluses of 267mL Jevity 1.2 daily.  NUTRITION DIAGNOSIS:   Inadequate oral intake related to lethargy/confusion, dysphagia as evidenced by meal completion < 50%, per patient/family report.  GOAL:   Patient will meet greater than or equal to 90% of their needs  MONITOR:   PO intake, Labs, I & O's, Weight trends, Diet advancement, Other (Comment)(Speech Pathology Evaluation results)  REASON FOR ASSESSMENT:   Low Braden    ASSESSMENT:   Martha Walters is a 84 y.o. female presenting with AMS due to acute CVA, dementia, cough. 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   Spoke with Martha Walters and Martha Walters at bedside. Martha Walters was feeding patient during visit. Patient likes magic cup and apple sauce in addition to food provided on tray. Appetite is good but Martha Walters describes early satiety. Reports that PTA appetite was good, patient eating 3 meals a day, no weight loss. Discussed with speech pathologist and RN. Awaiting results of MBS before family and MD make a decision about potential tube feeding. Discussed with Martha Walters that tube-feeding can be supplemental and is not necessarily permanent dependent upon patient's prognosis. She was receptive to this.  Labs reviewed: CBGs 268, 204, 237  Medications reviewed and include:  Insulin  NUTRITION - FOCUSED PHYSICAL EXAM:    Most Recent Value  Orbital Region  No depletion  Upper Arm Region  No depletion  Thoracic and Lumbar Region  No depletion  Buccal Region  No depletion   Temple Region  No depletion  Clavicle Bone Region  No depletion  Clavicle and Acromion Bone Region  No depletion  Scapular Bone Region  No depletion  Dorsal Hand  No depletion  Patellar Region  No depletion  Anterior Thigh Region  No depletion  Posterior Calf Region  No depletion  Edema (RD Assessment)  Mild       Diet Order:  Fall precautions Aspiration precautions DIET - DYS 1 Room service appropriate? Yes; Fluid consistency: Pudding Thick  EDUCATION NEEDS:   Not appropriate for education at this time  Skin:  Skin Assessment: Skin Integrity Issues: Skin Integrity Issues:: Other (Comment), Incisions Incisions: to both heels Other: MSAD to buttocks  Last BM:  02/25/2018  Height:   Ht Readings from Last 1 Encounters:  02/21/18 5\' 3"  (1.6 m)    Weight:   Wt Readings from Last 1 Encounters:  02/21/18 184 lb 11.9 oz (83.8 kg)    Ideal Body Weight:  52.27 kg  BMI:  Body mass index is 32.73 kg/m.  Estimated Nutritional Needs:   Kcal:  1500-1622 calories (MSJ x1.2-1.3)  Protein:  84-100 grams  Fluid:  1.5-1.7L  Satira Anis. Halvor Behrend, MS, RD LDN Inpatient Clinical Dietitian Pager [phone]

## 2018-02-26 NOTE — Progress Notes (Signed)
Modified Barium Swallow Progress Note  Patient Details  Name: Martha Walters MRN: [id number] Date of Birth: January 08, 1929  Today's Date: 02/26/2018  Modified Barium Swallow completed.  Full report located under Chart Review in the Imaging Section.  Brief recommendations include the following:  Clinical Impression  Pt presents with marginally improved oropharyngeal swallow.  There continues to be prolonged oral preparation and control, with significant delays before bolus material reaches the pharynx.  There continues to be incomplete closure of the laryngeal vestibule. With head in an upright position, pt demonstrated trace aspiration of all consistencies intermittently - aspiration evoked a cough response.  With chin tucked, pt was able to achieve improved airway protection with nectar thick liquids and purees (thins continued to be aspirated.)  Along with neuro deficits, fatigue is major barrier to pt meeting nutritional needs.  However, pt should be better able to maintain hydration now that she can drink nectar liquids.  Daughter, [name], was present for study and we discussed results and recommendations.  For now, recommend continuing dysphagia 1 diet; allow nectar-thick liquids with CHIN TUCK.  Continue meds crushed in puree.  SLP will follow.    Swallow Evaluation Recommendations       SLP Diet Recommendations: Dysphagia 1 (Puree) solids;Nectar thick liquid   Liquid Administration via: Straw;Spoon   Medication Administration: Crushed with puree   Supervision: Full assist for feeding;Full supervision/cueing for compensatory strategies   Compensations: Chin tuck;Slow rate;Minimize environmental distractions       Oral Care Recommendations: Oral care BID        [name] 02/26/2018,3:15 PM

## 2018-02-26 NOTE — Progress Notes (Signed)
Inpatient Diabetes Program Recommendations  AACE/ADA: New Consensus Statement on Inpatient Glycemic Control (2015)  Target Ranges:  Prepandial:   less than 140 mg/dL      Peak postprandial:   less than 180 mg/dL (1-2 hours)      Critically ill patients:  140 - 180 mg/dL   Lab Results  Component Value Date   GLUCAP 204 (H) 02/26/2018   HGBA1C 8.8 (H) 02/22/2018    Review of Glycemic Control Results for CARLEY, GLENDENNING (MRN [id number]) as of 02/26/2018 10:06  Ref. Range 02/25/2018 11:42 02/25/2018 16:32 02/25/2018 21:11 02/26/2018 06:37  Glucose-Capillary Latest Ref Range: 65 - 99 mg/dL 187 (H) 274 (H) 237 (H) 204 (H)   Diabetes history: Type 2 DM Outpatient Diabetes medications: Glipizide 5 mg in AM, Lantus 28 units qAM, Novolog 0-9 units TID, Novolog 5 units TIDAC Current orders for Inpatient glycemic control: Novolog 0-5 units QHS, Novolog 0-9 units TID  Inpatient Diabetes Program Recommendations:    Consider starting Lantus 8 units QAM (home regimen was Lantus 28 units QAM).   Thanks, Bronson Curb, MSN, RNC-OB Diabetes Coordinator [phone] (8a-5p)

## 2018-02-26 NOTE — Progress Notes (Signed)
Subjective:  Patient denies any chest pain or shortness of breath remains in A. Fib with moderate ventricular response. No further pauses noted.  Objective:  Vital Signs in the last 24 hours: Temp:  [97.6 F (36.4 C)-98.4 F (36.9 C)] 97.6 F (36.4 C) (03/21 0901) Pulse Rate:  [90-105] 103 (03/21 0901) Resp:  [16-18] 18 (03/21 0901) BP: (101-136)/(48-66) 121/64 (03/21 0901) SpO2:  [96 %-98 %] 96 % (03/21 0901)  Intake/Output from previous day: 03/20 0701 - 03/21 0700 In: 30 [P.O.:30] Out: -  Intake/Output from this shift: Total I/O In: 40 [P.O.:40] Out: -   Physical Exam: Neck: no adenopathy, no carotid bruit, no JVD and supple, symmetrical, trachea midline Lungs: clear to auscultation anterolaterally Heart: irregularly irregular rhythm, S1, S2 normal and soft systolic murmur noted Abdomen: soft, non-tender; bowel sounds normal; no masses,  no organomegaly Extremities: extremities normal, atraumatic, no cyanosis or edema  Lab Results: Recent Labs    02/25/18 0538 02/26/18 0651  WBC 52.8* 54.5*  HGB 10.8* 10.9*  PLT 47* 46*   Recent Labs    02/25/18 0538 02/26/18 0651  NA 135 139  K 4.3 4.4  CL 106 108  CO2 22 22  GLUCOSE 178* 209*  BUN 14 14  CREATININE 0.88 0.83   No results for input(s): TROPONINI in the last 72 hours.  Invalid input(s): CK, MB Hepatic Function Panel No results for input(s): PROT, ALBUMIN, AST, ALT, ALKPHOS, BILITOT, BILIDIR, IBILI in the last 72 hours. No results for input(s): CHOL in the last 72 hours. No results for input(s): PROTIME in the last 72 hours.  Imaging: Imaging results have been reviewed and No results found.  Cardiac Studies:  Assessment/Plan:  New-onset A. Fib with RVR chadsvasc score of 7 Status postSymptomatic sinus bradycardia with pauses/sinus arrest Tachybradycardia syndrome Sick sinus syndrome Chronic left bundle branch block Acute right para pontine CVA Hypertension Diabetes mellitus CLL Early  dementia Thrombocytopenia Hypothyroidism Dysphagia Possible right community-acquired pneumonia plan Continue present management I will sign off please call if needed   LOS: 5 days    Charolette Forward 02/26/2018, 11:44 AM

## 2018-02-26 NOTE — Progress Notes (Signed)
Physical Therapy Treatment Patient Details Name: Martha Walters MRN: [id number] DOB: 12/17/1928 Today's Date: 02/26/2018    History of Present Illness Pt is an 84 yo female admitted through the ED on 02/21/18 as a code stroke with slurred speech, difficulty transferring, L lateral lean and facial droop. 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 PMH significant for HTN, DM2, Thyroid disease.     PT Comments    Pt received in bed, recently returned from [place], resting, drifting in/out of sleep. Pt is easily awakened by greeting, and remains awake but very somnolent during session. Pt is interactive, but speech remains heavily dysarthric, often difficult to discern. Focus  Of session is bed-level therex, pt globally weaker by 25-50% on the left side, which daughter says is new today, RN reporting as consistent with 1DA. Consistently pt is able to perform partial ROM for each exercise requiring modA for the first 3-4 reps, but ultimately flaccid for the last 5 reps with no detectable limb activation. Recumbent with trunk at 25 degrees, the patient is able to slowing raise RUE 80% into the air directly parallel to line of gravity, but the LUE is significantly weaker, unable to fully raise. Will continue to follow acutely. Recommend lift for transfers to chair with nursing at this time.      Follow Up Recommendations  SNF     Equipment Recommendations  None recommended by PT    Recommendations for Other Services       Precautions / Restrictions Precautions Precautions: Fall Restrictions Weight Bearing Restrictions: No    Mobility  Bed Mobility Overal bed mobility: (not pursued this visit d/t gross weakness and drowsiness; Pt is lift appropriate with nursing)                Transfers                    Ambulation/Gait                 Stairs            Wheelchair  Mobility    Modified Rankin (Stroke Patients Only)       Balance                                            Cognition Arousal/Alertness: (drowsy) Behavior During Therapy: WFL for tasks assessed/performed Overall Cognitive Status: Difficult to assess                                 General Comments: difficult to isolate central fatigue v distractibility v AMS; coorperative druring therex, but requires max-total A toward end of each set.       Exercises General Exercises - Lower Extremity Short Arc QuadSinclair Ship;Both;10 reps;Supine Heel Slides: AAROM;Supine;Both;10 reps Hip ABduction/ADduction: AAROM;Supine;Both;10 reps Mini-Sqauts: Supine;AAROM;10 reps;Both(manually resisted)    General Comments        Pertinent Vitals/Pain Pain Assessment: No/denies pain    Home Living                      Prior Function            PT Goals (current goals can now be found in the care plan section)  Acute Rehab PT Goals Patient Stated Goal: family would like patient to got to rehab facility  PT Goal Formulation: With patient/family Time For Goal Achievement: 03/01/18 Potential to Achieve Goals: Fair Progress towards PT goals: Not progressing toward goals - comment    Frequency    Min 3X/week      PT Plan Current plan remains appropriate    Co-evaluation              AM-PAC PT "6 Clicks" Daily Activity  Outcome Measure  Difficulty turning over in bed (including adjusting bedclothes, sheets and blankets)?: Unable Difficulty moving from lying on back to sitting on the side of the bed? : Unable Difficulty sitting down on and standing up from a chair with arms (e.g., wheelchair, bedside commode, etc,.)?: Unable Help needed moving to and from a bed to chair (including a wheelchair)?: Total Help needed walking in hospital room?: Total Help needed climbing 3-5 steps with a railing? : Total 6 Click Score: 6    End of Session  Equipment Utilized During Treatment: Oxygen Activity Tolerance: Patient limited by fatigue;Patient limited by lethargy Patient left: in bed;with family/visitor present;with SCD's reapplied;with call bell/phone within reach;Other (comment)(heels floated and LUE elevated d/t fluid ) Nurse Communication: Mobility status PT Visit Diagnosis: Muscle weakness (generalized) (M62.81);Unsteadiness on feet (R26.81);Other symptoms and signs involving the nervous system (R29.898)     Time: 1442-1510 PT Time Calculation (min) (ACUTE ONLY): 28 min  Charges:  $Therapeutic Activity: 23-37 mins                    G Codes:     3:24 PM, 03-09-18 Etta Grandchild, PT, DPT Physical Therapist - [place] [phone] (Pager)  [phone] (Office)      Lindy Garczynski C 2018-03-09, 3:19 PM

## 2018-02-27 LAB — BASIC METABOLIC PANEL
Anion gap: 9 (ref 5–15)
BUN: 14 mg/dL (ref 6–20)
CO2: 23 mmol/L (ref 22–32)
Calcium: 8.3 mg/dL — ABNORMAL LOW (ref 8.9–10.3)
Chloride: 108 mmol/L (ref 101–111)
Creatinine, Ser: 0.81 mg/dL (ref 0.44–1.00)
GFR calc Af Amer: >60 mL/min (ref >60)
GLUCOSE: 221 mg/dL — AB (ref 65–99)
POTASSIUM: 3.9 mmol/L (ref 3.5–5.1)
Sodium: 140 mmol/L (ref 135–145)

## 2018-02-27 LAB — CBC
HEMATOCRIT: 36.9 % (ref 36.0–46.0)
Hemoglobin: 11.7 g/dL — ABNORMAL LOW (ref 12.0–15.0)
MCH: 30.2 pg (ref 26.0–34.0)
MCHC: 31.7 g/dL (ref 30.0–36.0)
MCV: 95.1 fL (ref 78.0–100.0)
PLATELETS: 53 10*3/uL — AB (ref 150–400)
RBC: 3.88 MIL/uL (ref 3.87–5.11)
RDW: 16.8 % — ABNORMAL HIGH (ref 11.5–15.5)
WBC: 66.3 10*3/uL (ref 4.0–10.5)

## 2018-02-27 LAB — GLUCOSE, CAPILLARY
Glucose-Capillary: 231 mg/dL — ABNORMAL HIGH (ref 65–99)
Glucose-Capillary: 293 mg/dL — ABNORMAL HIGH (ref 65–99)
Glucose-Capillary: 314 mg/dL — ABNORMAL HIGH (ref 65–99)

## 2018-02-27 LAB — PROTIME-INR
INR: 2.55
Prothrombin Time: 27.3 seconds — ABNORMAL HIGH (ref 11.4–15.2)

## 2018-02-27 MED ORDER — POLYETHYLENE GLYCOL 3350 17 G PO PACK: 17.0000 g | PACK | Freq: Every day | ORAL | 0 refills | Status: AC | PRN

## 2018-02-27 MED ORDER — SODIUM CHLORIDE 0.9 % IV BOLUS (SEPSIS)
500.0000 mL | Freq: Once | INTRAVENOUS | Status: AC
  Administered 2018-02-27: 500 mL via INTRAVENOUS

## 2018-02-27 MED ORDER — WARFARIN SODIUM 5 MG PO TABS: 5.0000 mg | ORAL_TABLET | Freq: Once | ORAL | Status: DC

## 2018-02-27 MED ORDER — ONDANSETRON HCL 4 MG PO TABS: 4.0000 mg | ORAL_TABLET | Freq: Once | ORAL | Status: DC

## 2018-02-27 MED ORDER — WARFARIN SODIUM 5 MG PO TABS: 5.0000 mg | ORAL_TABLET | Freq: Once | ORAL | Status: AC

## 2018-02-27 MED ORDER — AMOXICILLIN-POT CLAVULANATE 500-125 MG PO TABS: 1.0000 | ORAL_TABLET | Freq: Two times a day (BID) | ORAL | 0 refills | Status: AC

## 2018-02-27 MED ORDER — INSULIN GLARGINE 100 UNIT/ML ~~LOC~~ SOLN
5.0000 [IU] | Freq: Every day | SUBCUTANEOUS | Status: DC
  Administered 2018-02-27: 5 [IU] via SUBCUTANEOUS
  Filled 2018-02-27: qty 0.05

## 2018-02-27 MED ORDER — SODIUM CHLORIDE 0.9 % IV SOLN
1.0000 g | Freq: Once | INTRAVENOUS | Status: AC
  Administered 2018-02-27: 1 g via INTRAVENOUS
  Filled 2018-02-27: qty 10

## 2018-02-27 MED ORDER — INSULIN GLARGINE 100 UNIT/ML ~~LOC~~ SOLN: 5.0000 [IU] | Freq: Every day | SUBCUTANEOUS | 11 refills | Status: AC

## 2018-02-27 MED ORDER — BISACODYL 10 MG RE SUPP: 10.0000 mg | Freq: Every day | RECTAL | 0 refills | Status: AC | PRN

## 2018-02-27 MED ORDER — METOPROLOL TARTRATE 25 MG PO TABS: 12.5000 mg | ORAL_TABLET | Freq: Two times a day (BID) | ORAL | Status: AC

## 2018-02-27 MED ORDER — ONDANSETRON 4 MG PO TBDP
ORAL_TABLET | ORAL | Status: AC
  Administered 2018-02-27: 4 mg
  Filled 2018-02-27: qty 1

## 2018-02-27 NOTE — Clinical Social Work Placement (Signed)
   CLINICAL SOCIAL WORK PLACEMENT  NOTE  Date:  02/27/2018  Patient Details  Name: Martha Walters MRN: [id number] Date of Birth: [birth year]  Clinical Social Work is seeking post-discharge placement for this patient at the [place] level of care (*CSW will initial, date and re-position this form in  chart as items are completed):      Patient/family provided with [facility] Work Department's list of facilities offering this level of care within the geographic area requested by the patient (or if unable, by the patient's family).  Yes   Patient/family informed of their freedom to choose among providers that offer the needed level of care, that participate in Medicare, Medicaid or managed care program needed by the patient, have an available bed and are willing to accept the patient.      Patient/family informed of [facility]'s ownership interest in [facility] and [facility], as well as of the fact that they are under no obligation to receive care at these facilities.  PASRR submitted to EDS on       PASRR number received on 02/24/18     Existing PASRR number confirmed on       FL2 transmitted to all facilities in geographic area requested by pt/family on 02/24/18     FL2 transmitted to all facilities within larger geographic area on       Patient informed that his/her managed care company has contracts with or will negotiate with certain facilities, including the following:        Yes   Patient/family informed of bed offers received.  Patient chooses bed at [facility]     Physician recommends and patient chooses bed at      Patient to be transferred to [facility] on 02/27/18.  Patient to be transferred to facility by [facility]     Patient family notified on 02/27/18 of transfer.  Name of family member notified:  Rosa     PHYSICIAN Please prepare priority discharge summary, including medications, Please prepare prescriptions, Please  sign DNR     Additional Comment:    _______________________________________________ Eileen Stanford, LCSW 02/27/2018, 2:16 PM

## 2018-02-27 NOTE — Clinical Social Work Note (Signed)
Clinical Social Worker facilitated patient discharge including contacting patient family and facility to confirm patient discharge plans.  Clinical information faxed to facility and family agreeable with plan.  CSW arranged ambulance transport via [facility] to [facility] .  RN to call [phone] for 800 hall nurse, for report prior to discharge.  Clinical Social Worker will sign off for now as social work intervention is no longer needed. Please consult [place] again if new need arises.  [place], [place]

## 2018-02-27 NOTE — Progress Notes (Signed)
Inpatient Diabetes Program Recommendations  AACE/ADA: New Consensus Statement on Inpatient Glycemic Control (2015)  Target Ranges:  Prepandial:   less than 140 mg/dL      Peak postprandial:   less than 180 mg/dL (1-2 hours)      Critically ill patients:  140 - 180 mg/dL   Lab Results  Component Value Date   GLUCAP 231 (H) 02/27/2018   HGBA1C 8.8 (H) 02/22/2018    Review of Glycemic Control Results for Martha Walters, Martha Walters (MRN [id number]) as of 02/27/2018 09:00  Ref. Range 02/26/2018 11:45 02/26/2018 17:58 02/26/2018 22:10 02/27/2018 06:08  Glucose-Capillary Latest Ref Range: 65 - 99 mg/dL 268 (H) 337 (H) 289 (H) 231 (H)    Diabetes history: Type 2 DM Outpatient Diabetes medications: Glipizide 5 mg in AM, Lantus 28 units qAM, Novolog 0-9 units TID, Novolog 5 units TIDAC Current orders for Inpatient glycemic control: Novolog 0-5 units QHS, Novolog 0-9 units TID  Inpatient Diabetes Program Recommendations:    Patient no longer NPO, on thick nectar diet. Thus, expect BS to remain elevated. Please consider restarting Lantus 12 units QAM (home regimen was Lantus 28 units QAM), as FSBS was 231mg /dL.   Thanks, Bronson Curb, MSN, RNC-OB Diabetes Coordinator [phone] (8a-5p)

## 2018-02-27 NOTE — Progress Notes (Addendum)
Family Medicine Teaching Service Daily Progress Note Intern Pager: [phone]  Patient name: Martha Walters Medical record number: [id number] Date of birth: 26-Aug-1929 Age: 84 y.o. Gender: female  Primary Care Provider: Egbert Garibaldi, PA-C Consultants: neurology Code Status: DNR, confirmed with children as patient is demented  Pt Overview and Major Events to Date:  3/16 admitted with new pons CVA  Assessment and Plan: Dilana Mcphie is a 84 y.o. female presenting with AMS. 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   Acute R paramedian pons CVA:  MRI confirmed R paramedian pons infarct. Neurology following.   Per discussion with son and daughter, they would like to decline MRA and carotid US as they would not elect for surgical intervention if there were significant findings on these tests.  SLP recommends dys1, PT/OT recommend SNF.  Family not electing for invasive interventions at this time   Echo: Left ventricle with mild concentric hypertrophy. EF-55/60. Aortic valve w/trivial regurgitation.  - Mitral valve w/ Calcified annulus/ severely calcified leaflets/ mild stenosis/severe regurgitation.  Left atrium w/ mild dilation. Pulmonary arteries w/ Systolic pressure moderately increased.  No statin at request of family, risks were shared. -neuro checks  -stroke precautions -will end permissive HTN 3/23 (tommorow) -warfarin per pharm -recommended SNF by PT  New afib w/o RVR: Rate was 110s overnight 3/22 which is acceptable per cardiology with no pauses.  She was started on 12.5BID metoprolol 3/20.  Cautious on rate control given history of sinus pauses. -continue 12.5mg  metoprolol BID if no significant sinus pauses or rated rop -was held overnight for monitoring, stable for d/c to snf -cardiology has signed off but can be reached if new symptoms occur  Hypocalcemia: 3/20 8.3 3/22.   Mag wnl -ordered 1GM calcium gluconate  Vomiting overnight:  resolved if not reclined flat   Dysphagia- MBS on 3/21. Dysphagia 1 diet.Still with occasional coughing reported during eating.  Family is considering being open to PEG if required -Now nectar thick on dys 1  Community Acquired Pneumonia: Daughter reports history of recurrent pneumonias.  Concern for possible aspiration as a source.  Speech eval as above.  Chest x-ray with findings suggestive for right infrahilar airspace disease.  Malignancy is also a concern at this time.  Will trial antibiotics for community-acquired pneumonia and recommend repeat of chest x-ray in 3-4 weeks to assess for possible underlying pneumonia. --augmentin (3/18- )   ----s/p CTX/azithromycin  CLL: Leukocytosis to 77 - atypical lympocytes + smudge cells on diff. Hx of known CLL, follows with . Concern for hypercoagulable state and subtherapeutic INR as a possible cause of potential CVA as above.  -monitor on daily CBC  -consider heme onc vs calling her [facility] hematologist   Thrombocytopenia: appears chronic per Care Everywhere chart review.  -monitor on CBC   Hx of recurrent DVTs: daughter report on warfarin for this. Dosed at home by pill box through PCP, daughter denies missing doses. Subtherapeutic INR.  -hold pending further CVA workup above  Hx of GI ulcer: -no indication of acute bleed currently, will monitor  T2DM: on lantus 28U q am at home per daugther. A1c 8.8. NPO, will hold home lantus. -sSSI  -start 5u lantus daily 10am  Swelling in hands: could not take rings off due to swelling.  No immediate concern for circulation -elevated hands and will try with vaseline later in day.  R lateral 5th toe wound: appears chronic, question poor perfusion. Does not appear infected.  -wound care  consult  Hypothyroidism: hx of synthroid as home med.  TSH wnl -continue synthroid  Dementia- -home citalopram  Gout-  -Home allopurinol  FEN/GI: dys 1 Prophylaxis: SCDs  Disposition: will give 589ml bolus  and then d/c to snf when bed available, we are not recommending PEG at this time Subjective:  Feeling well, glad about improved MBS and diet.  Objective: Temp:  [97.6 F (36.4 C)-98.8 F (37.1 C)] 98.6 F (37 C) (03/22 0400) Pulse Rate:  [93-112] 112 (03/22 0400) Resp:  [18] 18 (03/22 0400) BP: (121-142)/(53-78) 136/55 (03/22 0400) SpO2:  [93 %-98 %] 93 % (03/22 0400) Physical Exam: General: frail elderly female in NAD, unchanged from prior day.  Improved eating. Cardiovascular: rate ~110 which is within cards recs, 3/6 systolic murmur Respiratory: CTAB, easy WOB Abdomen: SNTND, +BS Extremities: some puffiness in hands, rings were a little tight but with distal sensation/perfusion intact Neuro: persistent L facial droop, mild confusion and slurring of words unchanged from prior, sensation intact bilaterally  Laboratory: Recent Labs  Lab 02/25/18 0538 02/26/18 0651 02/27/18 0356  WBC 52.8* 54.5* 66.3*  HGB 10.8* 10.9* 11.7*  HCT 33.7* 34.5* 36.9  PLT 47* 46* 53*   Recent Labs  Lab 02/21/18 1105  02/25/18 0538 02/26/18 0651 02/27/18 0356  NA 134*   < > 135 139 140  K 4.7   < > 4.3 4.4 3.9  CL 101   < > 106 108 108  CO2 25   < > 22 22 23   BUN 12   < > 14 14 14   CREATININE 0.79   < > 0.88 0.83 0.81  CALCIUM 9.0   < > 7.8* 8.0* 8.3*  PROT 5.7*  --   --   --   --   BILITOT 0.8  --   --   --   --   ALKPHOS 91  --   --   --   --   ALT 26  --   --   --   --   AST 38  --   --   --   --   GLUCOSE 137*   < > 178* 209* 221*   < > = values in this interval not displayed.    Imaging/Diagnostic Tests: Dg Chest 2 View  Result Date: 02/21/2018 CLINICAL DATA:  Chest pain EXAM: CHEST - 2 VIEW COMPARISON:  10/31/2017 FINDINGS: Lungs are under aerated. There is focal opacity in the right infrahilar region. No pneumothorax. No pleural effusion. IMPRESSION: Findings are worrisome for right infrahilar airspace disease. Followup PA and lateral chest X-ray is recommended in 3-4 weeks  following trial of antibiotic therapy to ensure resolution and exclude underlying malignancy. Electronically Signed   By: Marybelle Killings M.D.   On: 02/21/2018 13:09   Mr Brain Wo Contrast  Result Date: 02/21/2018 CLINICAL DATA:  Code stroke, mildly altered, unable to transfer. EXAM: MRI HEAD WITHOUT CONTRAST TECHNIQUE: Multiplanar, multiecho pulse sequences of the brain and surrounding structures were obtained without intravenous contrast. COMPARISON:  Code stroke CT earlier today. FINDINGS: Brain: There is a small tubular area of restricted diffusion affecting the RIGHT paramedian pons consistent with acute infarction. No other areas of involvement are seen. There is no hemorrhage, mass lesion, or extra-axial fluid. There is extensive atrophy with prominence of the ventricles, cisterns, and sulci. Hypoattenuation of white matter representing small vessel disease is observed. Vascular: Flow voids are maintained throughout the carotid, basilar, and vertebral arteries. There is a small focus of  chronic hemorrhage in the RIGHT frontal lobe, either sequelae of old trauma or ischemia. Skull and upper cervical spine: Normal marrow signal. Sinuses/Orbits: Negative. Other: Correlating with the earlier CT, the infarct is not visible. IMPRESSION: Small area of acute brainstem infarction affecting the RIGHT paramedian pons, without associated large vessel occlusion. This infarction is consistent with a small vessel ischemic insult secondary to hypertension and/or diabetes. Advanced atrophy with chronic microvascular ischemic change elsewhere. No visible acute hemorrhage. Electronically Signed   By: Staci Righter M.D.   On: 02/21/2018 16:20   Dg Swallowing Func-speech Pathology  Result Date: 02/26/2018 Objective Swallowing Evaluation: Type of Study: MBS-Modified Barium Swallow Study  Patient Details Name: Colby Catanese MRN: [id number] Date of Birth: 11/19/1929 Today's Date: 02/26/2018 Time: SLP Start Time (ACUTE ONLY): 1330  -SLP Stop Time (ACUTE ONLY): 1400 SLP Time Calculation (min) (ACUTE ONLY): 30 min Past Medical History: Past Medical History: Diagnosis Date . Bruises easily  . Clotting disorder ([place])  . Diabetes mellitus without complication ([place])  . Hypertension  . Thyroid disease  Past Surgical History: Past Surgical History: Procedure Laterality Date . trigger finger injection   HPI: Ishi Danser a 84 y.o.femalepresenting with AMS. 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 Per MD pt has had PNA recently.  Subjective: alert; dtr [name] present Assessment / Plan / Recommendation CHL IP CLINICAL IMPRESSIONS 02/26/2018 Clinical Impression Pt presents with marginally improved oropharyngeal swallow.  There continues to be prolonged oral preparation and control, with significant delays before bolus material reaches the pharynx.  There continues to be incomplete closure of the laryngeal vestibule. With head in an upright position, pt demonstrated trace aspiration of all consistencies intermittently - aspiration evoked a cough response.  With chin tucked, pt was able to achieve improved airway protection with nectar thick liquids and purees (thins continued to be aspirated.)  Along with neuro deficits, fatigue is major barrier to pt meeting nutritional needs.  However, pt should be better able to maintain hydration now that she can drink nectar liquids.  Daughter, [name], was present for study and we discussed results and recommendations.  For now, recommend continuing dysphagia 1 diet; allow nectar-thick liquids with CHIN TUCK.  Continue meds crushed in puree.  SLP will follow.  SLP Visit Diagnosis Dysphagia, oropharyngeal phase (R13.12) Attention and concentration deficit following -- Frontal lobe and executive function deficit following -- Impact on safety and function  Moderate aspiration risk   CHL IP TREATMENT RECOMMENDATION 02/26/2018 Treatment Recommendations (No Data)   Prognosis 02/26/2018 Prognosis for Safe Diet Advancement Fair Barriers to Reach Goals -- Barriers/Prognosis Comment -- CHL IP DIET RECOMMENDATION 02/26/2018 SLP Diet Recommendations Dysphagia 1 (Puree) solids;Nectar thick liquid Liquid Administration via Straw;Spoon Medication Administration Crushed with puree Compensations Chin tuck;Slow rate;Minimize environmental distractions Postural Changes --   CHL IP OTHER RECOMMENDATIONS 02/26/2018 Recommended Consults -- Oral Care Recommendations Oral care BID Other Recommendations --   CHL IP FOLLOW UP RECOMMENDATIONS 02/26/2018 Follow up Recommendations Skilled Nursing facility   [facility] IP FREQUENCY AND DURATION 02/26/2018 Speech Therapy Frequency (ACUTE ONLY) min 2x/week Treatment Duration 1 week      CHL IP ORAL PHASE 02/26/2018 Oral Phase Impaired Oral - Pudding Teaspoon -- Oral - Pudding Cup -- Oral - Honey Teaspoon NT Oral - Honey Cup NT Oral - Nectar Teaspoon Weak lingual manipulation;Incomplete tongue to palate contact;Reduced posterior propulsion;Holding of bolus;Delayed oral transit;Decreased bolus  cohesion Oral - Nectar Cup -- Oral - Nectar Straw Weak lingual manipulation;Incomplete tongue to palate contact;Reduced posterior propulsion;Holding of bolus;Delayed oral transit;Decreased bolus cohesion Oral - Thin Teaspoon Weak lingual manipulation;Incomplete tongue to palate contact;Reduced posterior propulsion;Holding of bolus;Delayed oral transit;Decreased bolus cohesion Oral - Thin Cup NT Oral - Thin Straw -- Oral - Puree Weak lingual manipulation;Incomplete tongue to palate contact;Reduced posterior propulsion;Holding of bolus;Delayed oral transit;Decreased bolus cohesion Oral - Mech Soft -- Oral - Regular -- Oral - Multi-Consistency -- Oral - Pill -- Oral Phase - Comment --  CHL IP PHARYNGEAL PHASE 02/26/2018 Pharyngeal Phase Impaired Pharyngeal- Pudding Teaspoon --  Pharyngeal -- Pharyngeal- Pudding Cup -- Pharyngeal -- Pharyngeal- Honey Teaspoon NT Pharyngeal -- Pharyngeal- Honey Cup NT Pharyngeal -- Pharyngeal- Nectar Teaspoon Delayed swallow initiation-pyriform sinuses;Reduced epiglottic inversion;Reduced laryngeal elevation;Reduced airway/laryngeal closure;Reduced tongue base retraction;Penetration/Aspiration before swallow;Trace aspiration Pharyngeal -- Pharyngeal- Nectar Cup -- Pharyngeal -- Pharyngeal- Nectar Straw Delayed swallow initiation-pyriform sinuses;Reduced epiglottic inversion;Reduced laryngeal elevation;Reduced airway/laryngeal closure;Reduced tongue base retraction;Penetration/Aspiration before swallow;Trace aspiration;Compensatory strategies attempted (with notebox) Pharyngeal Material enters airway, passes BELOW cords and not ejected out despite cough attempt by patient;Material does not enter airway Pharyngeal- Thin Teaspoon Delayed swallow initiation-pyriform sinuses;Reduced epiglottic inversion;Reduced laryngeal elevation;Reduced airway/laryngeal closure;Reduced tongue base retraction;Penetration/Aspiration before swallow;Trace aspiration;Other (Comment) Pharyngeal Material enters airway, passes BELOW cords and not ejected out despite cough attempt by patient Pharyngeal- Thin Cup NT Pharyngeal -- Pharyngeal- Thin Straw -- Pharyngeal -- Pharyngeal- Puree Delayed swallow initiation-pyriform sinuses;Reduced epiglottic inversion;Reduced laryngeal elevation;Reduced airway/laryngeal closure;Reduced tongue base retraction Pharyngeal Material enters airway, remains ABOVE vocal cords then ejected out Pharyngeal- Mechanical Soft -- Pharyngeal -- Pharyngeal- Regular -- Pharyngeal -- Pharyngeal- Multi-consistency -- Pharyngeal -- Pharyngeal- Pill -- Pharyngeal -- Pharyngeal Comment --  CHL IP CERVICAL ESOPHAGEAL PHASE 02/22/2018 Cervical Esophageal Phase WFL Pudding Teaspoon -- Pudding Cup -- Honey Teaspoon -- Honey Cup -- Nectar Teaspoon -- Nectar Cup -- Nectar  Straw -- Thin Teaspoon -- Thin Cup -- Thin Straw -- Puree -- Mechanical Soft -- Regular -- Multi-consistency -- Pill -- Cervical Esophageal Comment -- No flowsheet data found. [name] 02/26/2018, 3:16 PM              Dg Swallowing Func-speech Pathology  Result Date: 02/22/2018 Objective Swallowing Evaluation: Type of Study: MBS-Modified Barium Swallow Study  Patient Details Name: Berlin Viereck MRN: [id number] Date of Birth: 1929/05/18 Today's Date: 02/22/2018 Time: SLP Start Time (ACUTE ONLY): 6387 -SLP Stop Time (ACUTE ONLY): 1505 SLP Time Calculation (min) (ACUTE ONLY): 20 min Past Medical History: Past Medical History: Diagnosis Date . Bruises easily  . Clotting disorder ([place])  . Diabetes mellitus without complication ([place])  . Hypertension  . Thyroid disease  Past Surgical History: Past Surgical History: Procedure Laterality Date . trigger finger injection   HPI: Letizia Hook a 84 y.o.femalepresenting with AMS. 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 Per MD pt has had PNA recently.  Subjective: Pt arrives to [place] with son and daughter Assessment / Plan / Recommendation CHL IP CLINICAL IMPRESSIONS 02/22/2018 Clinical Impression Patient presents with severe oropharyngeal dysphagia due to sensory, motor and cognitive impairments. Oral stage is characterized by prolonged oral holding, decreased oral manipulation leading to reduced bolus cohesion and delayed oral transit, premature spillage. Swallow initiation delayed, with prolonged pooling in the valleculae (puree) and pyriforms (honey, thin liquids). Pharyngeal stage characterized by mildly decreased base of tongue  retraction, decreased hyolaryngeal excursion with delayed, incomplete closure of the laryngeal vestibule. There is penetration before the swallow with thin liquids, with  eventual sensed aspiration, and penetration/aspiration during the swallow with honey-thick liquids due to spillage into the airway from liquid pooled in the pyriforms. Pt did sense aspiration, and SLP was able to remove some material via oral suction, though some barium did remain in the trachea. There was mild, shallow penetration with puree which was transient. Pt easily distracted during testing by anterior spillage and environmental commotion, requiring frequent verbal and tactile cues to refocus and initiate swallow. Son and daughter reviewed the study in real time and SLP explained findings, recommendations and rationale after completion of the exam. Recommend dys 1, pudding thick liquids by teaspoon, with full supervision and assistance, meds crushed. Feeding will be slow and effortful, and I anticipate pt will require significant cuing. Educated son and daughter in strategies including reducing environmental distractions, monitoring for swallow initiation, and cuing techniques. Will follow for tolerance, interventions to improve swallow function, caregiver education, consider repeat instrumental assessment if improvements at bedside. SLP Visit Diagnosis Dysphagia, oral phase (R13.11);Dysphagia, oropharyngeal phase (R13.12);Dysphagia, pharyngeal phase (R13.13) Attention and concentration deficit following -- Frontal lobe and executive function deficit following -- Impact on safety and function Severe aspiration risk   CHL IP TREATMENT RECOMMENDATION 02/22/2018 Treatment Recommendations Therapy as outlined in treatment plan below   Prognosis 02/22/2018 Prognosis for Safe Diet Advancement Fair Barriers to Reach Goals Cognitive deficits;Severity of deficits Barriers/Prognosis Comment -- CHL IP DIET RECOMMENDATION 02/22/2018 SLP Diet Recommendations Dysphagia 1 (Puree) solids;Pudding thick liquid Liquid Administration via Spoon Medication Administration Crushed with puree Compensations Slow rate;Small  sips/bites;Minimize environmental distractions Postural Changes Seated upright at 90 degrees   CHL IP OTHER RECOMMENDATIONS 02/22/2018 Recommended Consults -- Oral Care Recommendations Oral care BID;Oral care before and after PO Other Recommendations Order thickener from pharmacy;Prohibited food (jello, ice cream, thin soups);Remove water pitcher;Have oral suction available   CHL IP FOLLOW UP RECOMMENDATIONS 02/22/2018 Follow up Recommendations Skilled Nursing facility   [facility] IP FREQUENCY AND DURATION 02/22/2018 Speech Therapy Frequency (ACUTE ONLY) min 3x week Treatment Duration 2 weeks      CHL IP ORAL PHASE 02/22/2018 Oral Phase Impaired Oral - Pudding Teaspoon -- Oral - Pudding Cup -- Oral - Honey Teaspoon Left anterior bolus loss;Right anterior bolus loss;Reduced posterior propulsion;Holding of bolus;Delayed oral transit;Decreased bolus cohesion;Premature spillage Oral - Honey Cup Reduced posterior propulsion;Holding of bolus;Delayed oral transit;Decreased bolus cohesion;Premature spillage Oral - Nectar Teaspoon -- Oral - Nectar Cup -- Oral - Nectar Straw -- Oral - Thin Teaspoon Left anterior bolus loss;Right anterior bolus loss Oral - Thin Cup Left anterior bolus loss;Right anterior bolus loss;Reduced posterior propulsion;Holding of bolus;Delayed oral transit;Decreased bolus cohesion;Premature spillage Oral - Thin Straw -- Oral - Puree Reduced posterior propulsion;Holding of bolus;Delayed oral transit;Decreased bolus cohesion Oral - Mech Soft -- Oral - Regular -- Oral - Multi-Consistency -- Oral - Pill -- Oral Phase - Comment --  CHL IP PHARYNGEAL PHASE 02/22/2018 Pharyngeal Phase Impaired Pharyngeal- Pudding Teaspoon -- Pharyngeal -- Pharyngeal- Pudding Cup -- Pharyngeal -- Pharyngeal- Honey Teaspoon Reduced epiglottic inversion;Reduced anterior laryngeal mobility;Reduced laryngeal elevation;Reduced airway/laryngeal closure;Reduced tongue base retraction;Penetration/Aspiration during swallow;Pharyngeal residue -  valleculae Pharyngeal Material enters airway, remains ABOVE vocal cords and not ejected out Pharyngeal- Honey Cup Reduced epiglottic inversion;Reduced anterior laryngeal mobility;Reduced laryngeal elevation;Reduced airway/laryngeal closure;Reduced tongue base retraction;Penetration/Aspiration during swallow;Trace aspiration;Pharyngeal residue - valleculae Pharyngeal Material enters airway, passes BELOW cords and not ejected out despite cough  attempt by patient Pharyngeal- Nectar Teaspoon -- Pharyngeal -- Pharyngeal- Nectar Cup -- Pharyngeal -- Pharyngeal- Nectar Straw -- Pharyngeal -- Pharyngeal- Thin Teaspoon -- Pharyngeal -- Pharyngeal- Thin Cup Reduced epiglottic inversion;Reduced anterior laryngeal mobility;Reduced laryngeal elevation;Reduced airway/laryngeal closure;Reduced tongue base retraction;Penetration/Aspiration before swallow;Penetration/Aspiration during swallow;Moderate aspiration;Pharyngeal residue - valleculae;Pharyngeal residue - pyriform Pharyngeal Material enters airway, passes BELOW cords and not ejected out despite cough attempt by patient Pharyngeal- Thin Straw -- Pharyngeal -- Pharyngeal- Puree Reduced epiglottic inversion;Reduced anterior laryngeal mobility;Reduced laryngeal elevation;Reduced airway/laryngeal closure;Reduced tongue base retraction;Penetration/Aspiration during swallow;Pharyngeal residue - valleculae Pharyngeal Material enters airway, remains ABOVE vocal cords then ejected out;Material does not enter airway Pharyngeal- Mechanical Soft -- Pharyngeal -- Pharyngeal- Regular -- Pharyngeal -- Pharyngeal- Multi-consistency -- Pharyngeal -- Pharyngeal- Pill -- Pharyngeal -- Pharyngeal Comment --  CHL IP CERVICAL ESOPHAGEAL PHASE 02/22/2018 Cervical Esophageal Phase WFL Pudding Teaspoon -- Pudding Cup -- Honey Teaspoon -- Honey Cup -- Nectar Teaspoon -- Nectar Cup -- Nectar Straw -- Thin Teaspoon -- Thin Cup -- Thin Straw -- Puree -- Mechanical Soft -- Regular -- Multi-consistency --  Pill -- Cervical Esophageal Comment -- Deneise Lever, MS, CCC-SLP Speech-Language Pathologist [phone] No flowsheet data found. Aliene Altes 02/22/2018, 3:40 PM              Ct Head Code Stroke Wo Contrast  Result Date: 02/21/2018 CLINICAL DATA:  Code stroke. LEFT facial droop with slurred speech. Generalized weakness. Altered mental status. EXAM: CT HEAD WITHOUT CONTRAST TECHNIQUE: Contiguous axial images were obtained from the base of the skull through the vertex without intravenous contrast. COMPARISON:  CT head 09/30/2016. FINDINGS: Brain: No evidence for acute infarction, hemorrhage, mass lesion, hydrocephalus, or extra-axial fluid. Advanced atrophy. Chronic microvascular ischemic change. Vascular: Calcification of the cavernous internal carotid arteries and distal vertebral arteries consistent with cerebrovascular atherosclerotic disease. No signs of intracranial large vessel occlusion. Skull: Normal. Negative for fracture or focal lesion. Sinuses/Orbits: No acute finding. Other: Compared with priors, the RIGHT frontal parenchymal hemorrhage has resolved. ASPECTS [facility] Stroke Program Early CT Score) - Ganglionic level infarction (caudate, lentiform nuclei, internal capsule, insula, M1-M3 cortex): 7 - Supraganglionic infarction (M4-M6 cortex): 3 Total score (0-10 with 10 being normal): 10 IMPRESSION: 1. Atrophy and small vessel disease. No acute intracranial findings. 2. ASPECTS is 10. 3. These results were communicated to Dr. Cheral Marker at 11:03 amon 3/16/2019by text page via the [facility] messaging system. Electronically Signed   By: Staci Righter M.D.   On: 02/21/2018 11:05     Sherene Sires, DO 02/27/2018, 6:30 AM PGY-1, [place] Intern pager: [phone], text pages welcome

## 2018-02-27 NOTE — Progress Notes (Signed)
Report called to SNF; ambulance transport arrived to take patient; patient medicated for nausea with movement prior to discharge out. Daughter present at bedside. Vital signs stable for discharge; MD notified

## 2018-02-27 NOTE — Progress Notes (Signed)
  Speech Language Pathology Treatment: Dysphagia  Patient Details Name: Martha Walters MRN: [id number] DOB: [birth year] Today's Date: 02/27/2018 Time: 4888-9169 SLP Time Calculation (min) (ACUTE ONLY): 27 min  Assessment / Plan / Recommendation Clinical Impression  F/u after yesterday's MBS.  Daughter, Basilia Jumbo, present.  Reviewed with her results from [facility]  Pt brighter today.  Requires min verbal cues to tuck chin with nectars; tolerated well per observation this am. Basilia Jumbo remains concerned that pt will have difficulty maintaining hydration on current diet.  Our discussion focused on Mrs. Mackowiak improvements, the likelihood of continued improvement in swallowing, the many people who are successful at meeting hydration needs on nectar-thick liquids and, paradoxically, the people who are admitted with dehydration despite being on regular diets with regular liquids.  We discussed the benefit of dysphagia therapy at SNF, and the value of returning as an OP for another MBS in several weeks.  Rosa verbalized her understanding of current status, precautions, and plan pertaining to swallowing.      HPI HPI: Martha Walters a 84 y.o.femalepresenting with AMS. 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 Per MD pt has had PNA recently.      SLP Plan  Continue with current plan of care       Recommendations  Diet recommendations: Dysphagia 1 (puree);Nectar-thick liquid Liquids provided via: Straw;Teaspoon Medication Administration: Crushed with puree Supervision: Patient able to self feed;Full supervision/cueing for compensatory strategies Compensations: Chin tuck;Slow rate;Minimize environmental distractions Postural Changes and/or Swallow Maneuvers: Seated upright 90 degrees;Upright 30-60 min after meal                Oral Care Recommendations:  Oral care BID Follow up Recommendations: Skilled Nursing facility SLP Visit Diagnosis: Dysphagia, oropharyngeal phase (R13.12) Plan: Continue with current plan of care       GO                [name] 02/27/2018, 11:11 AM

## 2018-02-27 NOTE — Progress Notes (Signed)
[facility] for warfarin Indication: history of recurrent DVTs  Allergies  Allergen Reactions  . Phenergan [Promethazine Hcl] Other (See Comments)    Over Sedation  . Tetanus Toxoid Swelling  . Avelox [Moxifloxacin] Rash  . Levaquin [Levofloxacin In D5w] Hives  . Levofloxacin Hives  . Xarelto [Rivaroxaban]     Patient Measurements: Height: 5\' 3"  (160 cm) Weight: 184 lb 11.9 oz (83.8 kg) IBW/kg (Calculated) : 52.4  Vital Signs: Temp: 98 F (36.7 C) (03/22 0841) Temp Source: Oral (03/22 0841) BP: 137/62 (03/22 0841) Pulse Rate: 120 (03/22 0841)  Labs: Recent Labs    02/25/18 0538 02/26/18 0651 02/27/18 0356  HGB 10.8* 10.9* 11.7*  HCT 33.7* 34.5* 36.9  PLT 47* 46* 53*  LABPROT 24.1* 28.0* 27.3*  INR 2.18 2.64 2.55  CREATININE 0.88 0.83 0.81    Estimated Creatinine Clearance: 50.2 mL/min (by C-G formula based on SCr of 0.81 mg/dL).    Assessment: 74 yof on warfarin PTA for hx of recurrent DVTs - home regimen reported as 10 mg daily. Last dose was on 3/15 at 1900. INR on admission was 1.47.   MRI found small area of acute brainstem infarction affecting R paramedian pons. Okay per medical teams to resume warfarin.   INR = 2.55  Goal of Therapy:  INR 2-3 Monitor platelets by anticoagulation protocol: Yes   Plan:  Repeat Warfarin 5 mg po x 1 tonight Will monitor daily INR and CBC Monitor for signs/symptoms of bleeding  Thank you Anette Guarneri, PharmD [phone] 02/27/2018,10:04 AM

## 2018-02-27 NOTE — Clinical Social Work Note (Signed)
Pt will transfer to [facility] today. Paperwork has been completed by daughter and faxed to [facility]--fax confirmed.   Etna Green, [place]

## 2018-02-27 NOTE — Care Management Note (Signed)
Case Management Note  Patient Details  Name: Martha Walters MRN: [id number] Date of Birth: 04/30/1929  Subjective/Objective:                    Action/Plan: Pt discharging to [facility] today. CM signing off.   Expected Discharge Date:  02/27/18               Expected Discharge Plan:  Skilled Nursing Facility  In-House Referral:  Clinical Social Work  Discharge planning Services  CM Consult  Post Acute Care Choice:    Choice offered to:     DME Arranged:    DME Agency:     HH Arranged:    [place] Agency:     Status of Service:  Completed, signed off  If discussed at [employer], dates discussed:    Additional Comments:  Pollie Friar, RN 02/27/2018, 2:53 PM

## 2018-03-12 ENCOUNTER — Encounter: Payer: Self-pay | Admitting: Internal Medicine

## 2018-03-12 DIAGNOSIS — I5031 Acute diastolic (congestive) heart failure: Secondary | ICD-10-CM | POA: Diagnosis not present

## 2018-03-12 DIAGNOSIS — J690 Pneumonitis due to inhalation of food and vomit: Secondary | ICD-10-CM

## 2018-03-12 DIAGNOSIS — K219 Gastro-esophageal reflux disease without esophagitis: Secondary | ICD-10-CM | POA: Diagnosis not present

## 2018-03-12 DIAGNOSIS — R0603 Acute respiratory distress: Secondary | ICD-10-CM

## 2018-03-13 DIAGNOSIS — I5031 Acute diastolic (congestive) heart failure: Secondary | ICD-10-CM | POA: Diagnosis not present

## 2018-03-13 DIAGNOSIS — J690 Pneumonitis due to inhalation of food and vomit: Secondary | ICD-10-CM | POA: Diagnosis not present

## 2018-03-13 DIAGNOSIS — K219 Gastro-esophageal reflux disease without esophagitis: Secondary | ICD-10-CM | POA: Diagnosis not present

## 2018-03-13 DIAGNOSIS — R0603 Acute respiratory distress: Secondary | ICD-10-CM | POA: Diagnosis not present

## 2018-03-14 DIAGNOSIS — K219 Gastro-esophageal reflux disease without esophagitis: Secondary | ICD-10-CM | POA: Diagnosis not present

## 2018-03-14 DIAGNOSIS — R0603 Acute respiratory distress: Secondary | ICD-10-CM | POA: Diagnosis not present

## 2018-03-14 DIAGNOSIS — I5031 Acute diastolic (congestive) heart failure: Secondary | ICD-10-CM | POA: Diagnosis not present

## 2018-03-14 DIAGNOSIS — J690 Pneumonitis due to inhalation of food and vomit: Secondary | ICD-10-CM | POA: Diagnosis not present

## 2018-03-15 DIAGNOSIS — J690 Pneumonitis due to inhalation of food and vomit: Secondary | ICD-10-CM | POA: Diagnosis not present

## 2018-03-15 DIAGNOSIS — I5031 Acute diastolic (congestive) heart failure: Secondary | ICD-10-CM | POA: Diagnosis not present

## 2018-03-15 DIAGNOSIS — K219 Gastro-esophageal reflux disease without esophagitis: Secondary | ICD-10-CM | POA: Diagnosis not present

## 2018-03-15 DIAGNOSIS — R0603 Acute respiratory distress: Secondary | ICD-10-CM | POA: Diagnosis not present

## 2018-04-21 DIAGNOSIS — J9602 Acute respiratory failure with hypercapnia: Secondary | ICD-10-CM | POA: Diagnosis not present

## 2018-04-21 DIAGNOSIS — R0603 Acute respiratory distress: Secondary | ICD-10-CM | POA: Diagnosis not present

## 2018-04-21 DIAGNOSIS — C9190 Lymphoid leukemia, unspecified not having achieved remission: Secondary | ICD-10-CM

## 2018-04-21 DIAGNOSIS — I161 Hypertensive emergency: Secondary | ICD-10-CM

## 2018-04-21 DIAGNOSIS — N390 Urinary tract infection, site not specified: Secondary | ICD-10-CM | POA: Diagnosis not present

## 2018-04-21 DIAGNOSIS — E119 Type 2 diabetes mellitus without complications: Secondary | ICD-10-CM | POA: Diagnosis not present

## 2018-04-22 DIAGNOSIS — R1310 Dysphagia, unspecified: Secondary | ICD-10-CM | POA: Diagnosis not present

## 2018-04-22 DIAGNOSIS — D630 Anemia in neoplastic disease: Secondary | ICD-10-CM | POA: Diagnosis not present

## 2018-04-22 DIAGNOSIS — C9190 Lymphoid leukemia, unspecified not having achieved remission: Secondary | ICD-10-CM

## 2018-04-22 DIAGNOSIS — J189 Pneumonia, unspecified organism: Secondary | ICD-10-CM

## 2018-04-22 DIAGNOSIS — E440 Moderate protein-calorie malnutrition: Secondary | ICD-10-CM

## 2018-04-22 DIAGNOSIS — D696 Thrombocytopenia, unspecified: Secondary | ICD-10-CM

## 2018-04-22 DIAGNOSIS — J9602 Acute respiratory failure with hypercapnia: Secondary | ICD-10-CM

## 2018-04-22 DIAGNOSIS — C9112 Chronic lymphocytic leukemia of B-cell type in relapse: Secondary | ICD-10-CM | POA: Diagnosis not present

## 2018-04-22 DIAGNOSIS — Z7901 Long term (current) use of anticoagulants: Secondary | ICD-10-CM | POA: Diagnosis not present

## 2018-04-22 DIAGNOSIS — Z8673 Personal history of transient ischemic attack (TIA), and cerebral infarction without residual deficits: Secondary | ICD-10-CM | POA: Diagnosis not present

## 2018-04-22 DIAGNOSIS — N390 Urinary tract infection, site not specified: Secondary | ICD-10-CM

## 2018-04-22 DIAGNOSIS — N179 Acute kidney failure, unspecified: Secondary | ICD-10-CM | POA: Diagnosis not present

## 2018-04-22 DIAGNOSIS — J690 Pneumonitis due to inhalation of food and vomit: Secondary | ICD-10-CM | POA: Diagnosis not present

## 2018-04-22 DIAGNOSIS — R791 Abnormal coagulation profile: Secondary | ICD-10-CM | POA: Diagnosis not present

## 2018-04-22 DIAGNOSIS — E871 Hypo-osmolality and hyponatremia: Secondary | ICD-10-CM | POA: Diagnosis not present

## 2018-04-22 DIAGNOSIS — D649 Anemia, unspecified: Secondary | ICD-10-CM

## 2018-04-22 DIAGNOSIS — Z791 Long term (current) use of non-steroidal anti-inflammatories (NSAID): Secondary | ICD-10-CM | POA: Diagnosis not present

## 2018-04-22 DIAGNOSIS — Z86711 Personal history of pulmonary embolism: Secondary | ICD-10-CM | POA: Diagnosis not present

## 2018-04-22 DIAGNOSIS — I161 Hypertensive emergency: Secondary | ICD-10-CM

## 2018-04-22 DIAGNOSIS — Z978 Presence of other specified devices: Secondary | ICD-10-CM | POA: Diagnosis not present

## 2018-04-23 DIAGNOSIS — E871 Hypo-osmolality and hyponatremia: Secondary | ICD-10-CM | POA: Diagnosis not present

## 2018-04-23 DIAGNOSIS — J690 Pneumonitis due to inhalation of food and vomit: Secondary | ICD-10-CM | POA: Diagnosis not present

## 2018-04-23 DIAGNOSIS — J189 Pneumonia, unspecified organism: Secondary | ICD-10-CM | POA: Diagnosis not present

## 2018-04-23 DIAGNOSIS — R791 Abnormal coagulation profile: Secondary | ICD-10-CM | POA: Diagnosis not present

## 2018-04-23 DIAGNOSIS — E440 Moderate protein-calorie malnutrition: Secondary | ICD-10-CM | POA: Diagnosis not present

## 2018-04-23 DIAGNOSIS — N189 Chronic kidney disease, unspecified: Secondary | ICD-10-CM | POA: Diagnosis not present

## 2018-04-23 DIAGNOSIS — R1310 Dysphagia, unspecified: Secondary | ICD-10-CM | POA: Diagnosis not present

## 2018-04-23 DIAGNOSIS — N179 Acute kidney failure, unspecified: Secondary | ICD-10-CM | POA: Diagnosis not present

## 2018-04-23 DIAGNOSIS — C9112 Chronic lymphocytic leukemia of B-cell type in relapse: Secondary | ICD-10-CM | POA: Diagnosis not present

## 2018-04-23 DIAGNOSIS — Z978 Presence of other specified devices: Secondary | ICD-10-CM | POA: Diagnosis not present

## 2018-04-23 DIAGNOSIS — R0603 Acute respiratory distress: Secondary | ICD-10-CM | POA: Diagnosis not present

## 2018-04-23 DIAGNOSIS — Z8673 Personal history of transient ischemic attack (TIA), and cerebral infarction without residual deficits: Secondary | ICD-10-CM | POA: Diagnosis not present

## 2018-04-23 DIAGNOSIS — Z86718 Personal history of other venous thrombosis and embolism: Secondary | ICD-10-CM | POA: Diagnosis not present

## 2018-04-23 DIAGNOSIS — D649 Anemia, unspecified: Secondary | ICD-10-CM

## 2018-04-23 DIAGNOSIS — D899 Disorder involving the immune mechanism, unspecified: Secondary | ICD-10-CM

## 2018-04-23 DIAGNOSIS — R0602 Shortness of breath: Secondary | ICD-10-CM | POA: Diagnosis not present

## 2018-04-23 DIAGNOSIS — Z791 Long term (current) use of non-steroidal anti-inflammatories (NSAID): Secondary | ICD-10-CM | POA: Diagnosis not present

## 2018-04-23 DIAGNOSIS — E875 Hyperkalemia: Secondary | ICD-10-CM | POA: Diagnosis not present

## 2018-04-23 DIAGNOSIS — Z7901 Long term (current) use of anticoagulants: Secondary | ICD-10-CM | POA: Diagnosis not present

## 2018-04-23 DIAGNOSIS — D696 Thrombocytopenia, unspecified: Secondary | ICD-10-CM

## 2018-04-24 DIAGNOSIS — D696 Thrombocytopenia, unspecified: Secondary | ICD-10-CM | POA: Diagnosis not present

## 2018-04-24 DIAGNOSIS — E440 Moderate protein-calorie malnutrition: Secondary | ICD-10-CM | POA: Diagnosis not present

## 2018-04-24 DIAGNOSIS — R1310 Dysphagia, unspecified: Secondary | ICD-10-CM | POA: Diagnosis not present

## 2018-04-24 DIAGNOSIS — Z8673 Personal history of transient ischemic attack (TIA), and cerebral infarction without residual deficits: Secondary | ICD-10-CM | POA: Diagnosis not present

## 2018-04-24 DIAGNOSIS — N179 Acute kidney failure, unspecified: Secondary | ICD-10-CM | POA: Diagnosis not present

## 2018-04-24 DIAGNOSIS — R791 Abnormal coagulation profile: Secondary | ICD-10-CM | POA: Diagnosis not present

## 2018-04-24 DIAGNOSIS — J690 Pneumonitis due to inhalation of food and vomit: Secondary | ICD-10-CM | POA: Diagnosis not present

## 2018-04-24 DIAGNOSIS — E871 Hypo-osmolality and hyponatremia: Secondary | ICD-10-CM | POA: Diagnosis not present

## 2018-04-24 DIAGNOSIS — J189 Pneumonia, unspecified organism: Secondary | ICD-10-CM | POA: Diagnosis not present

## 2018-04-24 DIAGNOSIS — D649 Anemia, unspecified: Secondary | ICD-10-CM | POA: Diagnosis not present

## 2018-04-24 DIAGNOSIS — Z978 Presence of other specified devices: Secondary | ICD-10-CM | POA: Diagnosis not present

## 2018-04-24 DIAGNOSIS — Z791 Long term (current) use of non-steroidal anti-inflammatories (NSAID): Secondary | ICD-10-CM | POA: Diagnosis not present

## 2018-04-25 DIAGNOSIS — J690 Pneumonitis due to inhalation of food and vomit: Secondary | ICD-10-CM | POA: Diagnosis not present

## 2018-04-25 DIAGNOSIS — R1310 Dysphagia, unspecified: Secondary | ICD-10-CM | POA: Diagnosis not present

## 2018-04-25 DIAGNOSIS — J189 Pneumonia, unspecified organism: Secondary | ICD-10-CM | POA: Diagnosis not present

## 2018-04-25 DIAGNOSIS — D649 Anemia, unspecified: Secondary | ICD-10-CM | POA: Diagnosis not present

## 2018-04-25 DIAGNOSIS — Z791 Long term (current) use of non-steroidal anti-inflammatories (NSAID): Secondary | ICD-10-CM | POA: Diagnosis not present

## 2018-04-25 DIAGNOSIS — R0603 Acute respiratory distress: Secondary | ICD-10-CM | POA: Diagnosis not present

## 2018-04-25 DIAGNOSIS — E440 Moderate protein-calorie malnutrition: Secondary | ICD-10-CM | POA: Diagnosis not present

## 2018-04-25 DIAGNOSIS — D696 Thrombocytopenia, unspecified: Secondary | ICD-10-CM | POA: Diagnosis not present

## 2018-04-25 DIAGNOSIS — N179 Acute kidney failure, unspecified: Secondary | ICD-10-CM | POA: Diagnosis not present

## 2018-04-25 DIAGNOSIS — E871 Hypo-osmolality and hyponatremia: Secondary | ICD-10-CM | POA: Diagnosis not present

## 2018-04-25 DIAGNOSIS — R791 Abnormal coagulation profile: Secondary | ICD-10-CM | POA: Diagnosis not present

## 2018-04-25 DIAGNOSIS — Z978 Presence of other specified devices: Secondary | ICD-10-CM | POA: Diagnosis not present

## 2018-04-25 DIAGNOSIS — C9112 Chronic lymphocytic leukemia of B-cell type in relapse: Secondary | ICD-10-CM

## 2018-04-25 DIAGNOSIS — Z8673 Personal history of transient ischemic attack (TIA), and cerebral infarction without residual deficits: Secondary | ICD-10-CM | POA: Diagnosis not present

## 2018-05-03 DIAGNOSIS — E119 Type 2 diabetes mellitus without complications: Secondary | ICD-10-CM | POA: Diagnosis not present

## 2018-05-03 DIAGNOSIS — N390 Urinary tract infection, site not specified: Secondary | ICD-10-CM

## 2018-05-03 DIAGNOSIS — D649 Anemia, unspecified: Secondary | ICD-10-CM

## 2018-05-03 DIAGNOSIS — I4891 Unspecified atrial fibrillation: Secondary | ICD-10-CM

## 2018-05-03 DIAGNOSIS — J690 Pneumonitis due to inhalation of food and vomit: Secondary | ICD-10-CM | POA: Diagnosis not present

## 2018-05-03 DIAGNOSIS — D696 Thrombocytopenia, unspecified: Secondary | ICD-10-CM | POA: Diagnosis not present

## 2018-05-03 DIAGNOSIS — J189 Pneumonia, unspecified organism: Secondary | ICD-10-CM | POA: Diagnosis not present

## 2018-05-03 DIAGNOSIS — Z8673 Personal history of transient ischemic attack (TIA), and cerebral infarction without residual deficits: Secondary | ICD-10-CM | POA: Diagnosis not present

## 2018-05-03 DIAGNOSIS — C9190 Lymphoid leukemia, unspecified not having achieved remission: Secondary | ICD-10-CM | POA: Diagnosis not present

## 2018-05-03 DIAGNOSIS — R791 Abnormal coagulation profile: Secondary | ICD-10-CM | POA: Diagnosis not present

## 2018-05-04 DIAGNOSIS — D696 Thrombocytopenia, unspecified: Secondary | ICD-10-CM | POA: Diagnosis not present

## 2018-05-04 DIAGNOSIS — I4891 Unspecified atrial fibrillation: Secondary | ICD-10-CM | POA: Diagnosis not present

## 2018-05-04 DIAGNOSIS — C9190 Lymphoid leukemia, unspecified not having achieved remission: Secondary | ICD-10-CM | POA: Diagnosis not present

## 2018-05-04 DIAGNOSIS — J690 Pneumonitis due to inhalation of food and vomit: Secondary | ICD-10-CM | POA: Diagnosis not present

## 2018-05-04 DIAGNOSIS — N390 Urinary tract infection, site not specified: Secondary | ICD-10-CM | POA: Diagnosis not present

## 2018-05-04 DIAGNOSIS — D649 Anemia, unspecified: Secondary | ICD-10-CM | POA: Diagnosis not present

## 2018-05-04 DIAGNOSIS — J189 Pneumonia, unspecified organism: Secondary | ICD-10-CM | POA: Diagnosis not present

## 2018-05-04 DIAGNOSIS — R791 Abnormal coagulation profile: Secondary | ICD-10-CM | POA: Diagnosis not present

## 2018-05-04 DIAGNOSIS — E119 Type 2 diabetes mellitus without complications: Secondary | ICD-10-CM | POA: Diagnosis not present

## 2018-05-04 DIAGNOSIS — Z8673 Personal history of transient ischemic attack (TIA), and cerebral infarction without residual deficits: Secondary | ICD-10-CM | POA: Diagnosis not present

## 2018-05-05 DIAGNOSIS — D649 Anemia, unspecified: Secondary | ICD-10-CM

## 2018-05-05 DIAGNOSIS — R791 Abnormal coagulation profile: Secondary | ICD-10-CM | POA: Diagnosis not present

## 2018-05-05 DIAGNOSIS — C9112 Chronic lymphocytic leukemia of B-cell type in relapse: Secondary | ICD-10-CM

## 2018-05-05 DIAGNOSIS — D72829 Elevated white blood cell count, unspecified: Secondary | ICD-10-CM

## 2018-05-05 DIAGNOSIS — E119 Type 2 diabetes mellitus without complications: Secondary | ICD-10-CM | POA: Diagnosis not present

## 2018-05-05 DIAGNOSIS — I4891 Unspecified atrial fibrillation: Secondary | ICD-10-CM | POA: Diagnosis not present

## 2018-05-05 DIAGNOSIS — J189 Pneumonia, unspecified organism: Secondary | ICD-10-CM | POA: Diagnosis not present

## 2018-05-05 DIAGNOSIS — J690 Pneumonitis due to inhalation of food and vomit: Secondary | ICD-10-CM | POA: Diagnosis not present

## 2018-05-05 DIAGNOSIS — D696 Thrombocytopenia, unspecified: Secondary | ICD-10-CM | POA: Diagnosis not present

## 2018-05-05 DIAGNOSIS — N390 Urinary tract infection, site not specified: Secondary | ICD-10-CM | POA: Diagnosis not present

## 2018-05-05 DIAGNOSIS — D801 Nonfamilial hypogammaglobulinemia: Secondary | ICD-10-CM

## 2018-05-05 DIAGNOSIS — J9600 Acute respiratory failure, unspecified whether with hypoxia or hypercapnia: Secondary | ICD-10-CM | POA: Diagnosis not present

## 2018-05-05 DIAGNOSIS — C9190 Lymphoid leukemia, unspecified not having achieved remission: Secondary | ICD-10-CM | POA: Diagnosis not present

## 2018-05-05 DIAGNOSIS — Z8673 Personal history of transient ischemic attack (TIA), and cerebral infarction without residual deficits: Secondary | ICD-10-CM | POA: Diagnosis not present

## 2018-05-06 DIAGNOSIS — C9190 Lymphoid leukemia, unspecified not having achieved remission: Secondary | ICD-10-CM | POA: Diagnosis not present

## 2018-05-06 DIAGNOSIS — J690 Pneumonitis due to inhalation of food and vomit: Secondary | ICD-10-CM | POA: Diagnosis not present

## 2018-05-06 DIAGNOSIS — D696 Thrombocytopenia, unspecified: Secondary | ICD-10-CM | POA: Diagnosis not present

## 2018-05-06 DIAGNOSIS — N390 Urinary tract infection, site not specified: Secondary | ICD-10-CM | POA: Diagnosis not present

## 2018-05-06 DIAGNOSIS — R791 Abnormal coagulation profile: Secondary | ICD-10-CM | POA: Diagnosis not present

## 2018-05-06 DIAGNOSIS — Z8673 Personal history of transient ischemic attack (TIA), and cerebral infarction without residual deficits: Secondary | ICD-10-CM | POA: Diagnosis not present

## 2018-05-06 DIAGNOSIS — E119 Type 2 diabetes mellitus without complications: Secondary | ICD-10-CM | POA: Diagnosis not present

## 2018-05-06 DIAGNOSIS — J189 Pneumonia, unspecified organism: Secondary | ICD-10-CM | POA: Diagnosis not present

## 2018-05-06 DIAGNOSIS — I4891 Unspecified atrial fibrillation: Secondary | ICD-10-CM | POA: Diagnosis not present

## 2018-05-06 DIAGNOSIS — D649 Anemia, unspecified: Secondary | ICD-10-CM | POA: Diagnosis not present

## 2018-05-07 DIAGNOSIS — C9190 Lymphoid leukemia, unspecified not having achieved remission: Secondary | ICD-10-CM | POA: Diagnosis not present

## 2018-05-07 DIAGNOSIS — Z8673 Personal history of transient ischemic attack (TIA), and cerebral infarction without residual deficits: Secondary | ICD-10-CM | POA: Diagnosis not present

## 2018-05-07 DIAGNOSIS — J690 Pneumonitis due to inhalation of food and vomit: Secondary | ICD-10-CM | POA: Diagnosis not present

## 2018-05-07 DIAGNOSIS — I4891 Unspecified atrial fibrillation: Secondary | ICD-10-CM | POA: Diagnosis not present

## 2018-05-07 DIAGNOSIS — D696 Thrombocytopenia, unspecified: Secondary | ICD-10-CM | POA: Diagnosis not present

## 2018-05-07 DIAGNOSIS — D649 Anemia, unspecified: Secondary | ICD-10-CM | POA: Diagnosis not present

## 2018-05-07 DIAGNOSIS — N390 Urinary tract infection, site not specified: Secondary | ICD-10-CM | POA: Diagnosis not present

## 2018-05-07 DIAGNOSIS — E119 Type 2 diabetes mellitus without complications: Secondary | ICD-10-CM | POA: Diagnosis not present

## 2018-05-07 DIAGNOSIS — R791 Abnormal coagulation profile: Secondary | ICD-10-CM | POA: Diagnosis not present

## 2018-05-07 DIAGNOSIS — J189 Pneumonia, unspecified organism: Secondary | ICD-10-CM | POA: Diagnosis not present

## 2018-05-08 DIAGNOSIS — D649 Anemia, unspecified: Secondary | ICD-10-CM | POA: Diagnosis not present

## 2018-05-08 DIAGNOSIS — Z8673 Personal history of transient ischemic attack (TIA), and cerebral infarction without residual deficits: Secondary | ICD-10-CM | POA: Diagnosis not present

## 2018-05-08 DIAGNOSIS — D696 Thrombocytopenia, unspecified: Secondary | ICD-10-CM | POA: Diagnosis not present

## 2018-05-08 DIAGNOSIS — N390 Urinary tract infection, site not specified: Secondary | ICD-10-CM | POA: Diagnosis not present

## 2018-05-08 DIAGNOSIS — E119 Type 2 diabetes mellitus without complications: Secondary | ICD-10-CM | POA: Diagnosis not present

## 2018-05-08 DIAGNOSIS — J189 Pneumonia, unspecified organism: Secondary | ICD-10-CM | POA: Diagnosis not present

## 2018-05-08 DIAGNOSIS — J690 Pneumonitis due to inhalation of food and vomit: Secondary | ICD-10-CM | POA: Diagnosis not present

## 2018-05-08 DIAGNOSIS — I4891 Unspecified atrial fibrillation: Secondary | ICD-10-CM | POA: Diagnosis not present

## 2018-05-08 DIAGNOSIS — R791 Abnormal coagulation profile: Secondary | ICD-10-CM | POA: Diagnosis not present

## 2018-05-08 DIAGNOSIS — C9190 Lymphoid leukemia, unspecified not having achieved remission: Secondary | ICD-10-CM | POA: Diagnosis not present

## 2018-05-09 DIAGNOSIS — N390 Urinary tract infection, site not specified: Secondary | ICD-10-CM | POA: Diagnosis not present

## 2018-05-09 DIAGNOSIS — C9190 Lymphoid leukemia, unspecified not having achieved remission: Secondary | ICD-10-CM | POA: Diagnosis not present

## 2018-05-09 DIAGNOSIS — Z8673 Personal history of transient ischemic attack (TIA), and cerebral infarction without residual deficits: Secondary | ICD-10-CM | POA: Diagnosis not present

## 2018-05-09 DIAGNOSIS — J189 Pneumonia, unspecified organism: Secondary | ICD-10-CM | POA: Diagnosis not present

## 2018-05-09 DIAGNOSIS — J690 Pneumonitis due to inhalation of food and vomit: Secondary | ICD-10-CM | POA: Diagnosis not present

## 2018-05-09 DIAGNOSIS — E119 Type 2 diabetes mellitus without complications: Secondary | ICD-10-CM | POA: Diagnosis not present

## 2018-05-09 DIAGNOSIS — D649 Anemia, unspecified: Secondary | ICD-10-CM | POA: Diagnosis not present

## 2018-05-09 DIAGNOSIS — D696 Thrombocytopenia, unspecified: Secondary | ICD-10-CM | POA: Diagnosis not present

## 2018-05-09 DIAGNOSIS — R791 Abnormal coagulation profile: Secondary | ICD-10-CM | POA: Diagnosis not present

## 2018-05-09 DIAGNOSIS — I4891 Unspecified atrial fibrillation: Secondary | ICD-10-CM | POA: Diagnosis not present

## 2018-06-03 DIAGNOSIS — I69354 Hemiplegia and hemiparesis following cerebral infarction affecting left non-dominant side: Secondary | ICD-10-CM

## 2018-06-03 DIAGNOSIS — C9112 Chronic lymphocytic leukemia of B-cell type in relapse: Secondary | ICD-10-CM | POA: Diagnosis not present

## 2018-06-03 DIAGNOSIS — E875 Hyperkalemia: Secondary | ICD-10-CM

## 2018-06-03 DIAGNOSIS — L89159 Pressure ulcer of sacral region, unspecified stage: Secondary | ICD-10-CM

## 2018-06-03 DIAGNOSIS — J209 Acute bronchitis, unspecified: Secondary | ICD-10-CM

## 2018-06-03 DIAGNOSIS — D801 Nonfamilial hypogammaglobulinemia: Secondary | ICD-10-CM | POA: Diagnosis not present

## 2018-06-03 DIAGNOSIS — D630 Anemia in neoplastic disease: Secondary | ICD-10-CM | POA: Diagnosis not present

## 2018-06-03 DIAGNOSIS — D6959 Other secondary thrombocytopenia: Secondary | ICD-10-CM | POA: Diagnosis not present

## 2018-06-06 DIAGNOSIS — E440 Moderate protein-calorie malnutrition: Secondary | ICD-10-CM

## 2018-06-06 DIAGNOSIS — C9190 Lymphoid leukemia, unspecified not having achieved remission: Secondary | ICD-10-CM

## 2018-06-06 DIAGNOSIS — J9621 Acute and chronic respiratory failure with hypoxia: Secondary | ICD-10-CM

## 2018-06-06 DIAGNOSIS — D696 Thrombocytopenia, unspecified: Secondary | ICD-10-CM

## 2018-06-06 DIAGNOSIS — E119 Type 2 diabetes mellitus without complications: Secondary | ICD-10-CM

## 2018-06-06 DIAGNOSIS — J811 Chronic pulmonary edema: Secondary | ICD-10-CM | POA: Diagnosis not present

## 2018-06-06 DIAGNOSIS — R1310 Dysphagia, unspecified: Secondary | ICD-10-CM

## 2018-06-06 DIAGNOSIS — I4891 Unspecified atrial fibrillation: Secondary | ICD-10-CM

## 2018-06-07 DIAGNOSIS — I4891 Unspecified atrial fibrillation: Secondary | ICD-10-CM | POA: Diagnosis not present

## 2018-06-07 DIAGNOSIS — J811 Chronic pulmonary edema: Secondary | ICD-10-CM | POA: Diagnosis not present

## 2018-06-07 DIAGNOSIS — J9621 Acute and chronic respiratory failure with hypoxia: Secondary | ICD-10-CM | POA: Diagnosis not present

## 2018-06-07 DIAGNOSIS — C9190 Lymphoid leukemia, unspecified not having achieved remission: Secondary | ICD-10-CM | POA: Diagnosis not present

## 2018-06-08 DIAGNOSIS — J811 Chronic pulmonary edema: Secondary | ICD-10-CM | POA: Diagnosis not present

## 2018-06-08 DIAGNOSIS — I4891 Unspecified atrial fibrillation: Secondary | ICD-10-CM | POA: Diagnosis not present

## 2018-06-08 DIAGNOSIS — J9621 Acute and chronic respiratory failure with hypoxia: Secondary | ICD-10-CM | POA: Diagnosis not present

## 2018-06-08 DIAGNOSIS — C9190 Lymphoid leukemia, unspecified not having achieved remission: Secondary | ICD-10-CM | POA: Diagnosis not present

## 2018-07-09 DEATH — deceased

## 2019-10-16 IMAGING — DX DG CHEST 2V
2 series · 2 of 2 positions shown · non-contrast
Comparison: 10/31/2017

CLINICAL DATA: Chest pain

EXAM:
CHEST - 2 VIEW

[chest lat]
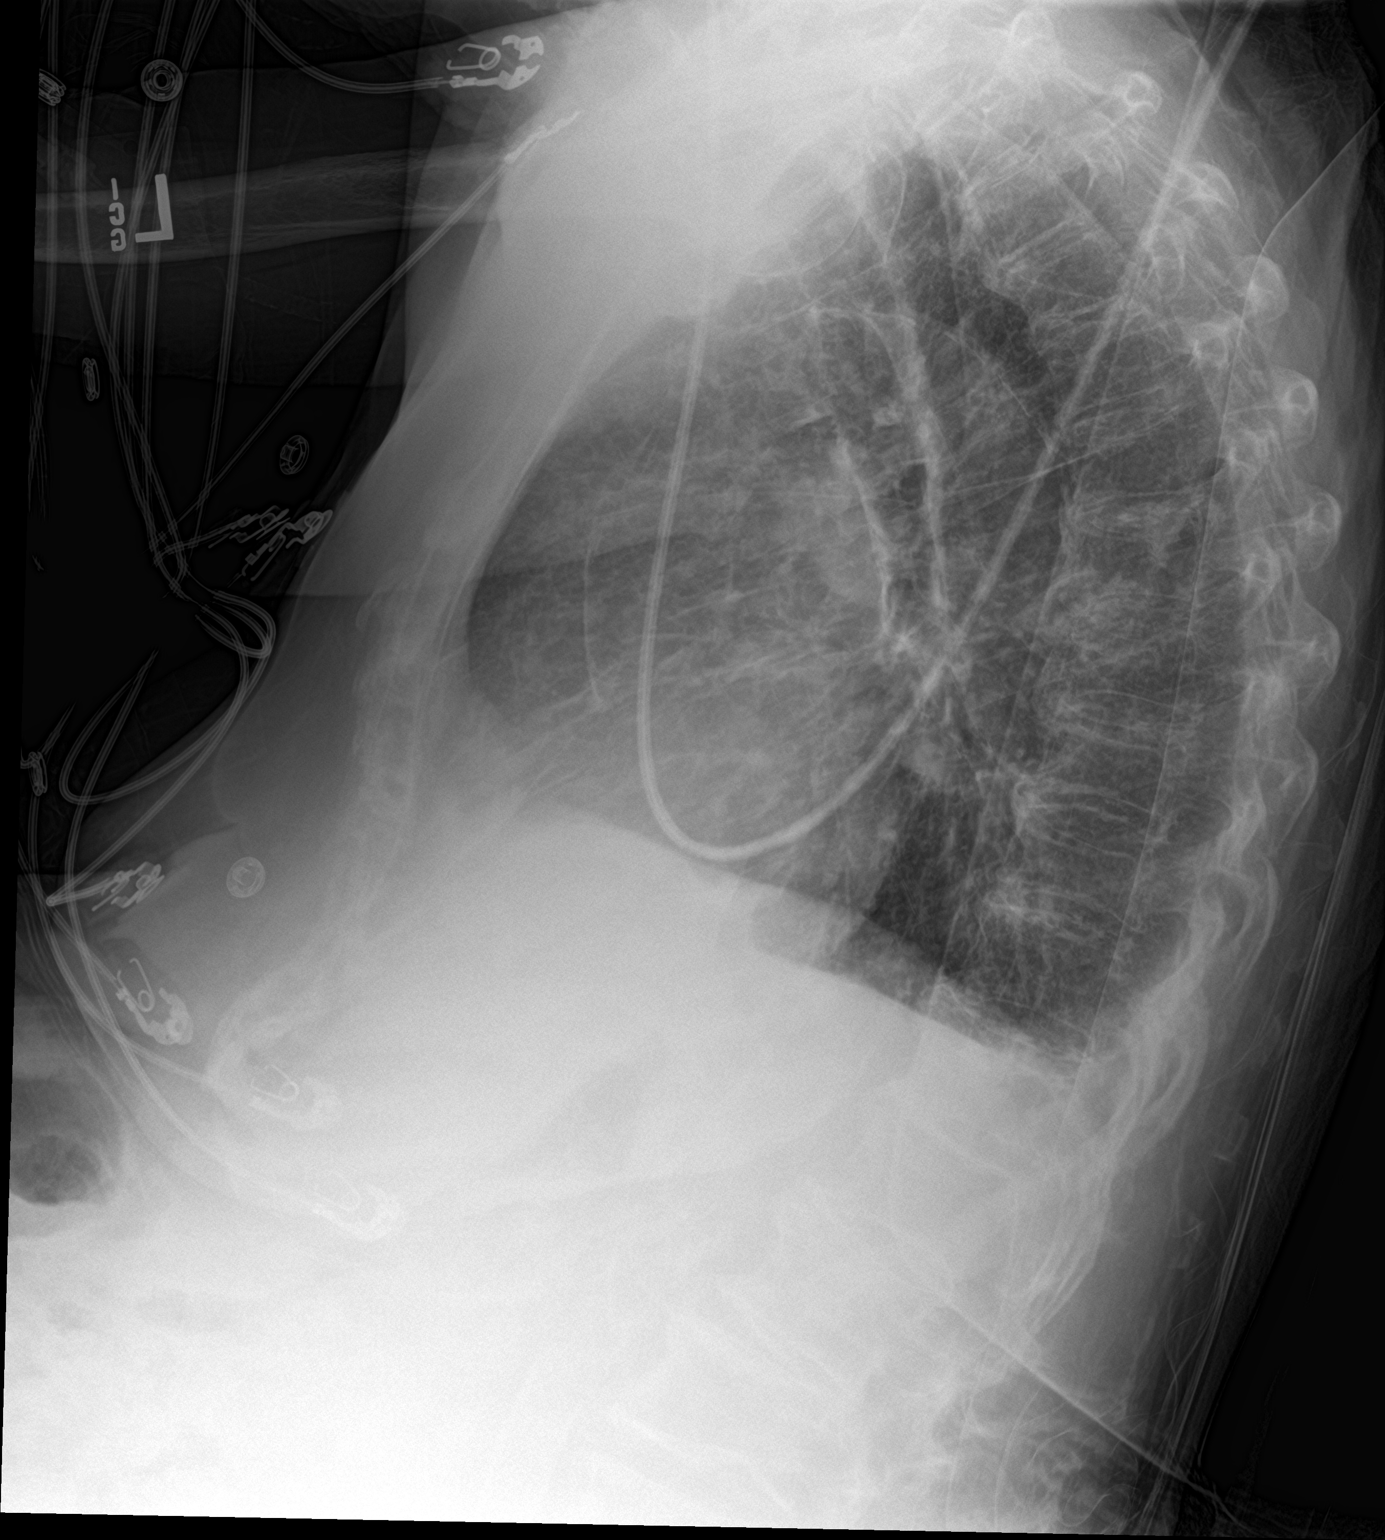

[chest ap]
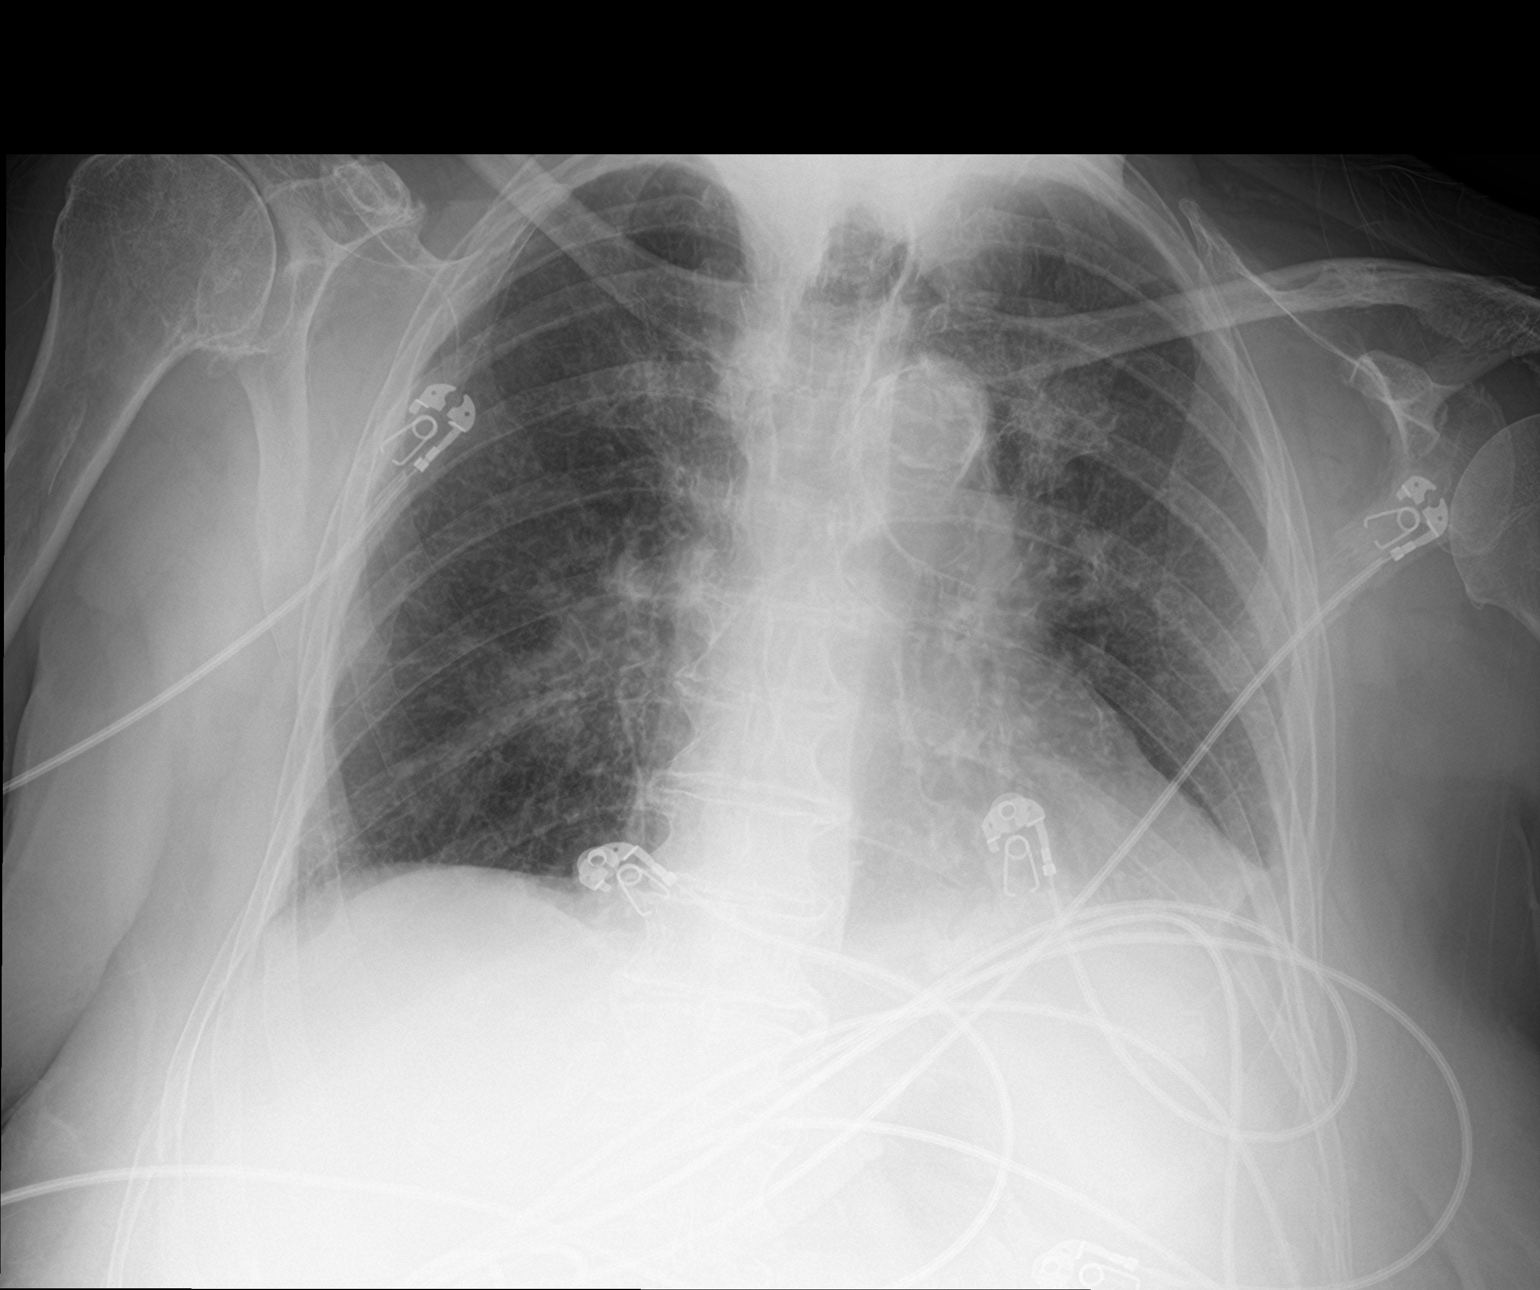

[2 of 2 positions shown; findings below may reference images not displayed]

FINDINGS: Lungs are under aerated. There is focal opacity in the right
infrahilar region. No pneumothorax. No pleural effusion.
IMPRESSION: Findings are worrisome for right infrahilar airspace disease.
Followup PA and lateral chest X-ray is recommended in 3-4 weeks
following trial of antibiotic therapy to ensure resolution and
exclude underlying malignancy.
